# Patient Record
Sex: Female | Born: 1950 | Race: Black or African American | Hispanic: No | Marital: Single | Smoking: Former smoker
Health system: Southern US, Community
[De-identification: ages and names within clinical notes are randomized; demographics above are authoritative.]

## PROBLEM LIST (undated history)

## (undated) DIAGNOSIS — J8410 Pulmonary fibrosis, unspecified: Secondary | ICD-10-CM

## (undated) DIAGNOSIS — M069 Rheumatoid arthritis, unspecified: Secondary | ICD-10-CM

## (undated) DIAGNOSIS — G5600 Carpal tunnel syndrome, unspecified upper limb: Secondary | ICD-10-CM

## (undated) DIAGNOSIS — E7800 Pure hypercholesterolemia, unspecified: Secondary | ICD-10-CM

## (undated) DIAGNOSIS — M3500 Sicca syndrome, unspecified: Secondary | ICD-10-CM

## (undated) DIAGNOSIS — G709 Myoneural disorder, unspecified: Secondary | ICD-10-CM

## (undated) DIAGNOSIS — J301 Allergic rhinitis due to pollen: Secondary | ICD-10-CM

## (undated) DIAGNOSIS — M810 Age-related osteoporosis without current pathological fracture: Secondary | ICD-10-CM

## (undated) HISTORY — DX: Rheumatoid arthritis, unspecified: M06.9

## (undated) HISTORY — DX: Pulmonary fibrosis, unspecified: J84.10

## (undated) HISTORY — DX: Pure hypercholesterolemia, unspecified: E78.00

## (undated) HISTORY — DX: Allergic rhinitis due to pollen: J30.1

## (undated) HISTORY — DX: Myoneural disorder, unspecified: G70.9

## (undated) HISTORY — DX: Age-related osteoporosis without current pathological fracture: M81.0

## (undated) HISTORY — DX: Sjogren syndrome, unspecified: M35.00

## (undated) HISTORY — DX: Carpal tunnel syndrome, unspecified upper limb: G56.00

## (undated) HISTORY — PX: COLONOSCOPY: SHX174

---

## 1987-11-22 HISTORY — PX: TUBAL LIGATION: SHX77

## 1998-04-27 ENCOUNTER — Ambulatory Visit (HOSPITAL_COMMUNITY): Admission: RE | Admit: 1998-04-27 | Discharge: 1998-04-27 | Payer: Self-pay | Admitting: Internal Medicine

## 1998-05-05 ENCOUNTER — Ambulatory Visit (HOSPITAL_COMMUNITY): Admission: RE | Admit: 1998-05-05 | Discharge: 1998-05-05 | Payer: Self-pay | Admitting: Internal Medicine

## 2000-01-20 ENCOUNTER — Encounter: Payer: Self-pay | Admitting: Internal Medicine

## 2000-01-20 ENCOUNTER — Ambulatory Visit (HOSPITAL_COMMUNITY): Admission: RE | Admit: 2000-01-20 | Discharge: 2000-01-20 | Payer: Self-pay | Admitting: Internal Medicine

## 2001-01-01 ENCOUNTER — Ambulatory Visit (HOSPITAL_COMMUNITY): Admission: RE | Admit: 2001-01-01 | Discharge: 2001-01-01 | Payer: Self-pay | Admitting: Gastroenterology

## 2001-01-01 ENCOUNTER — Encounter (INDEPENDENT_AMBULATORY_CARE_PROVIDER_SITE_OTHER): Payer: Self-pay

## 2002-09-26 ENCOUNTER — Other Ambulatory Visit: Admission: RE | Admit: 2002-09-26 | Discharge: 2002-09-26 | Payer: Self-pay | Admitting: Obstetrics and Gynecology

## 2003-12-08 ENCOUNTER — Other Ambulatory Visit: Admission: RE | Admit: 2003-12-08 | Discharge: 2003-12-08 | Payer: Self-pay | Admitting: Obstetrics and Gynecology

## 2004-12-06 ENCOUNTER — Other Ambulatory Visit: Admission: RE | Admit: 2004-12-06 | Discharge: 2004-12-06 | Payer: Self-pay | Admitting: Obstetrics and Gynecology

## 2005-09-05 ENCOUNTER — Encounter: Admission: RE | Admit: 2005-09-05 | Discharge: 2005-09-05 | Payer: Self-pay | Admitting: Internal Medicine

## 2005-10-28 ENCOUNTER — Other Ambulatory Visit: Admission: RE | Admit: 2005-10-28 | Discharge: 2005-10-28 | Payer: Self-pay | Admitting: Internal Medicine

## 2006-08-14 ENCOUNTER — Encounter: Admission: RE | Admit: 2006-08-14 | Discharge: 2006-08-14 | Payer: Self-pay | Admitting: Internal Medicine

## 2007-11-06 ENCOUNTER — Other Ambulatory Visit: Admission: RE | Admit: 2007-11-06 | Discharge: 2007-11-06 | Payer: Self-pay | Admitting: Internal Medicine

## 2008-05-08 ENCOUNTER — Emergency Department (HOSPITAL_COMMUNITY): Admission: EM | Admit: 2008-05-08 | Discharge: 2008-05-08 | Payer: Self-pay | Admitting: Family Medicine

## 2009-12-21 ENCOUNTER — Other Ambulatory Visit: Admission: RE | Admit: 2009-12-21 | Discharge: 2009-12-21 | Payer: Self-pay | Admitting: Internal Medicine

## 2010-02-12 ENCOUNTER — Ambulatory Visit (HOSPITAL_BASED_OUTPATIENT_CLINIC_OR_DEPARTMENT_OTHER): Admission: RE | Admit: 2010-02-12 | Discharge: 2010-02-12 | Payer: Self-pay | Admitting: Ophthalmology

## 2011-03-02 ENCOUNTER — Encounter: Payer: Self-pay | Admitting: Internal Medicine

## 2011-03-25 ENCOUNTER — Ambulatory Visit
Admission: RE | Admit: 2011-03-25 | Discharge: 2011-03-25 | Disposition: A | Discharge status: Home / Self Care | Payer: BC Managed Care – PPO | Source: Ambulatory Visit | Attending: Internal Medicine | Admitting: Internal Medicine

## 2011-03-25 ENCOUNTER — Other Ambulatory Visit: Payer: Self-pay | Admitting: Internal Medicine

## 2011-03-25 DIAGNOSIS — R52 Pain, unspecified: Secondary | ICD-10-CM

## 2011-04-08 NOTE — Procedures (Signed)
[facility]  Patient:    Lorraine Blair, Lorraine Blair                   MRN: [id number] Proc. Date: 01/01/01 Adm. Date:  [id number] Attending:  Nelda Marseille CC:         Lind Guest. August Saucer, M.D.   Procedure Report  PROCEDURE:  Colonoscopy.  SURGEON:  Petra Kuba, M.D.  INDICATION:  Screening.  Consent was signed after risks, benefits, methods, and options were thoroughly discussed in the office.  MEDICINES USED:  Demerol 60 and Versed 6.  DESCRIPTION OF PROCEDURE:  Rectal inspection was pertinent for small external hemorrhoids.  Digital exam was negative.  The video pediatric colonoscope was inserted and easily advanced around the colon to the cecum.  This did require some left lower quadrant pressure and no position changes.  The cecum was identified by the appendiceal orifice and the ileocecal valve.  No obvious abnormality was seen on insertion.  The scope was inserted a short ways into the terminal ileum which was normal.  Photo documentation was obtained.  The scope was slowly withdrawn.  The prep was adequate.  There was some liquid stool that requiring washing and suctioning on slow withdrawal through the colon.  The ascending, transverse, descending, and majority of the sigmoid were normal.  In the distal sigmoid and rectum, three tiny probable hyperplastic-appearing polyps were seen and were cold biopsied x 1 or 2, and all put in the same container.  Once back in the rectum, the scope was retroflexed and pertinent for some internal hemorrhoids.  The scope was straightened, and readvanced a short ways up the sigmoid.  Air was suctioned and the scope removed.  The patient tolerated the procedure well.  There was no obvious or immediate complication.  ENDOSCOPIC DIAGNOSES: 1. Internal and external hemorrhoids. 2. Three distal sigmoid and rectal probable hyperplastic-appearing polyps,    status post cold biopsy. 4. Otherwise within normal limits  to the terminal ileum.  PLAN:  Await pathology to determine future colonic screening.  GI followup p.r.n. or yearly rectals and guaiacs per Dr. August Saucer or gynecology, as well as other health care maintenance. DD:  01/01/01 TD:  01/02/01 Job: [id number]

## 2011-10-11 ENCOUNTER — Encounter (HOSPITAL_BASED_OUTPATIENT_CLINIC_OR_DEPARTMENT_OTHER): Admission: RE | Payer: Self-pay | Source: Ambulatory Visit

## 2011-10-11 ENCOUNTER — Ambulatory Visit (HOSPITAL_BASED_OUTPATIENT_CLINIC_OR_DEPARTMENT_OTHER)
Admission: RE | Admit: 2011-10-11 | Payer: BC Managed Care – PPO | Source: Ambulatory Visit | Admitting: Orthopedic Surgery

## 2011-10-11 SURGERY — CARPAL TUNNEL RELEASE
Anesthesia: Choice | Laterality: Right

## 2011-11-18 ENCOUNTER — Ambulatory Visit
Admission: RE | Admit: 2011-11-18 | Discharge: 2011-11-18 | Disposition: A | Discharge status: Home / Self Care | Payer: BC Managed Care – PPO | Source: Ambulatory Visit | Attending: Internal Medicine | Admitting: Internal Medicine

## 2011-11-18 ENCOUNTER — Other Ambulatory Visit: Payer: Self-pay | Admitting: Internal Medicine

## 2011-11-18 ENCOUNTER — Other Ambulatory Visit (HOSPITAL_COMMUNITY)
Admission: RE | Admit: 2011-11-18 | Discharge: 2011-11-18 | Disposition: A | Discharge status: Home / Self Care | Payer: BC Managed Care – PPO | Source: Ambulatory Visit | Attending: Internal Medicine | Admitting: Internal Medicine

## 2011-11-18 DIAGNOSIS — M25561 Pain in right knee: Secondary | ICD-10-CM

## 2011-11-18 DIAGNOSIS — Z01419 Encounter for gynecological examination (general) (routine) without abnormal findings: Secondary | ICD-10-CM | POA: Insufficient documentation

## 2011-11-18 DIAGNOSIS — Z113 Encounter for screening for infections with a predominantly sexual mode of transmission: Secondary | ICD-10-CM | POA: Insufficient documentation

## 2012-03-10 LAB — BASIC METABOLIC PANEL
BUN: 13 mg/dL (ref 4–21)
Glucose: 83 mg/dL
Sodium: 140 mmol/L (ref 137–147)

## 2012-03-10 LAB — HEPATIC FUNCTION PANEL: Bilirubin, Total: 0.3 mg/dL

## 2012-07-27 ENCOUNTER — Other Ambulatory Visit: Payer: Self-pay | Admitting: Internal Medicine

## 2012-07-27 DIAGNOSIS — R1310 Dysphagia, unspecified: Secondary | ICD-10-CM

## 2012-08-01 ENCOUNTER — Other Ambulatory Visit: Payer: BC Managed Care – PPO

## 2012-08-10 ENCOUNTER — Ambulatory Visit
Admission: RE | Admit: 2012-08-10 | Discharge: 2012-08-10 | Disposition: A | Discharge status: Home / Self Care | Payer: BC Managed Care – PPO | Source: Ambulatory Visit | Attending: Internal Medicine | Admitting: Internal Medicine

## 2012-08-10 DIAGNOSIS — R1310 Dysphagia, unspecified: Secondary | ICD-10-CM

## 2013-01-02 ENCOUNTER — Encounter (HOSPITAL_BASED_OUTPATIENT_CLINIC_OR_DEPARTMENT_OTHER): Payer: BC Managed Care – PPO

## 2013-04-03 ENCOUNTER — Encounter: Payer: Self-pay | Admitting: Hematology

## 2013-06-24 ENCOUNTER — Encounter: Payer: Self-pay | Admitting: Internal Medicine

## 2013-06-24 ENCOUNTER — Ambulatory Visit (INDEPENDENT_AMBULATORY_CARE_PROVIDER_SITE_OTHER): Payer: BC Managed Care – PPO | Admitting: Internal Medicine

## 2013-06-24 ENCOUNTER — Telehealth: Payer: Self-pay | Admitting: Internal Medicine

## 2013-06-24 VITALS — BP 123/82 | HR 93 | Temp 98.7°F | Wt 170.0 lb

## 2013-06-24 DIAGNOSIS — E785 Hyperlipidemia, unspecified: Secondary | ICD-10-CM | POA: Insufficient documentation

## 2013-06-24 DIAGNOSIS — R109 Unspecified abdominal pain: Secondary | ICD-10-CM

## 2013-06-24 DIAGNOSIS — R1030 Lower abdominal pain, unspecified: Secondary | ICD-10-CM | POA: Insufficient documentation

## 2013-06-24 DIAGNOSIS — Z87898 Personal history of other specified conditions: Secondary | ICD-10-CM

## 2013-06-24 NOTE — Progress Notes (Signed)
Subjective:    Patient ID: Lorraine Blair, female    DOB: 1950/12/05, 62 y.o.   MRN: [id number]  HPI: Very healthy 62 year old here for annual visit and to establish care. Pt states that she has been relatively healthy except for occasional cramping pain in the supra pubic area. Pt states that she had been having the pain more frequently in the past and then it seemed to have subsided. However she has had 2 episodes in the last 2 weeks. The pain is of sudden onset and lasting for about 1-1/2 minutes and it resolves spontaneously. It is cramping and non-radiating in nature, and localized to the supra-pubic area. It is not associated with any other symptoms, and he is unable to identify any palliative ot provocative features.  In review of her records it is noted that she was prescribed Crestor for Hyperlipidemia, but has not taken it for almost 1 year.  She also has Carpel tunnel Syndrome and was under the care of Dr. Merlyn Lot. She has not followed up and stated that since she has retired, she has had minimal symptoms limited to numbness with prolonged positioning. She does not want to pursue any surgical intervention at this time.     Review of Systems  Constitutional: Negative.   HENT: Negative.   Eyes: Negative.   Respiratory: Negative.   Cardiovascular: Negative.   Gastrointestinal: Negative.   Endocrine: Negative.   Genitourinary: Negative.  Negative for vaginal bleeding, vaginal discharge and difficulty urinating.  Musculoskeletal: Negative for myalgias.  Skin: Negative.   Allergic/Immunologic: Negative.   Neurological: Positive for numbness (Patient has numbness and tingling bilateral arms with prolonged positioning at wrists.).  Hematological: Negative.   Psychiatric/Behavioral: Negative.        Objective:   Physical Exam  Constitutional: She is oriented to person, place, and time. She appears well-developed and well-nourished.  HENT:  Head: Normocephalic and atraumatic.   Eyes: Conjunctivae and EOM are normal. Pupils are equal, round, and reactive to light. No scleral icterus.  Neck: Normal range of motion. Neck supple. No JVD present. No thyromegaly present.  Cardiovascular: Normal rate and regular rhythm.  Exam reveals no gallop and no friction rub.   No murmur heard. Pulmonary/Chest: Effort normal and breath sounds normal. She has no wheezes. She has no rales.  Abdominal: Soft. Bowel sounds are normal. She exhibits no distension and no mass. There is no tenderness.  Musculoskeletal: Normal range of motion.  Neurological: She is alert and oriented to person, place, and time. No cranial nerve deficit.  Skin: Skin is warm and dry.  Psychiatric: She has a normal mood and affect. Her behavior is normal. Judgment and thought content normal.          Assessment & Plan:  1. Abdominal Pain: Pt's description of pain is most consistent with bladder spasms vs abdominal wall spasms. Pt does not want any antispasmodics at this time. Will observe.  2. Hyperlipidemia: Will check fasting lipid profile particularly since patient has discontinued her Crestor.  3. Carpel tunnel Syndrome: Pt appears to have decreased symptoms since she has had decreased use of BUE's associated with her work duties. No further intervention at this time.  4. Family history of PE: Pt's daughter died of a PE and she had a concern about her risk. Her daughter had no clear risk factors and patient is concerned about her own risk of a hypercoagulability. I will check a hypercoagulable panel on this patient.  5. Health maintenance: Pt  has had a recent mammogram and does not remember when she had her last pelvic examination. Although she is not sexually active. Will review records.

## 2013-06-25 ENCOUNTER — Other Ambulatory Visit: Payer: BC Managed Care – PPO | Admitting: *Deleted

## 2013-06-25 ENCOUNTER — Other Ambulatory Visit: Payer: Self-pay | Admitting: Internal Medicine

## 2013-06-25 DIAGNOSIS — R109 Unspecified abdominal pain: Secondary | ICD-10-CM

## 2013-06-25 DIAGNOSIS — E785 Hyperlipidemia, unspecified: Secondary | ICD-10-CM

## 2013-06-25 DIAGNOSIS — Z87898 Personal history of other specified conditions: Secondary | ICD-10-CM

## 2013-06-25 LAB — BASIC METABOLIC PANEL
BUN: 12 mg/dL (ref 6–23)
Calcium: 9.3 mg/dL (ref 8.4–10.5)
Glucose, Bld: 98 mg/dL (ref 70–99)
Sodium: 137 mEq/L (ref 135–145)

## 2013-06-25 LAB — CBC WITH DIFFERENTIAL/PLATELET
Basophils Absolute: 0 10*3/uL (ref 0.0–0.1)
HCT: 35.5 % — ABNORMAL LOW (ref 36.0–46.0)
Lymphocytes Relative: 56 % — ABNORMAL HIGH (ref 12–46)
Neutro Abs: 1.1 10*3/uL — ABNORMAL LOW (ref 1.7–7.7)
Neutrophils Relative %: 34 % — ABNORMAL LOW (ref 43–77)
Platelets: 235 10*3/uL (ref 150–400)
RDW: 15.6 % — ABNORMAL HIGH (ref 11.5–15.5)
WBC: 3.4 10*3/uL — ABNORMAL LOW (ref 4.0–10.5)

## 2013-06-25 LAB — LIPID PANEL
Cholesterol: 193 mg/dL (ref 0–200)
HDL: 52 mg/dL
LDL Cholesterol: 123 mg/dL — ABNORMAL HIGH (ref 0–99)
Total CHOL/HDL Ratio: 3.7 ratio
Triglycerides: 91 mg/dL
VLDL: 18 mg/dL (ref 0–40)

## 2013-06-27 LAB — HYPERCOAGULABLE PANEL, COMPREHENSIVE
Anticardiolipin IgG: 8 GPL U/mL (ref <23)
Beta-2 Glyco I IgG: 6 G Units (ref <20)
Beta-2-Glycoprotein I IgA: 18 A Units (ref <20)
Lupus Anticoagulant: NOT DETECTED
Protein C Activity: 146 % — ABNORMAL HIGH (ref 75–133)
Protein C, Total: 65 % — ABNORMAL LOW (ref 72–160)
Protein S Total: 91 % (ref 60–150)

## 2014-02-19 LAB — HM MAMMOGRAPHY

## 2014-03-27 ENCOUNTER — Encounter: Payer: BC Managed Care – PPO | Admitting: Internal Medicine

## 2014-04-07 ENCOUNTER — Encounter: Payer: BC Managed Care – PPO | Admitting: Internal Medicine

## 2014-07-17 ENCOUNTER — Encounter: Payer: Self-pay | Admitting: Family Medicine

## 2014-07-17 ENCOUNTER — Ambulatory Visit (INDEPENDENT_AMBULATORY_CARE_PROVIDER_SITE_OTHER): Payer: BC Managed Care – PPO | Admitting: Family Medicine

## 2014-07-17 VITALS — BP 131/79 | HR 75 | Temp 98.5°F | Resp 16 | Ht 66.0 in | Wt 165.0 lb

## 2014-07-17 DIAGNOSIS — Z1211 Encounter for screening for malignant neoplasm of colon: Secondary | ICD-10-CM | POA: Insufficient documentation

## 2014-07-17 DIAGNOSIS — Z0000 Encounter for general adult medical examination without abnormal findings: Secondary | ICD-10-CM

## 2014-07-17 DIAGNOSIS — E785 Hyperlipidemia, unspecified: Secondary | ICD-10-CM

## 2014-07-17 DIAGNOSIS — Z23 Encounter for immunization: Secondary | ICD-10-CM | POA: Insufficient documentation

## 2014-07-17 NOTE — Progress Notes (Signed)
   Subjective:    Patient ID: Lorraine Blair, female    DOB: 07/12/1951, 63 y.o.   MRN: [id number]  HPI  Lorraine Blair is a 63 year old well appearing female that presents for follow up of hyperlipidemia. She states that she has been doing well and has not had any illnesses throughout the year. Lorraine Blair denies chest pain, dyspnea, fatigue, palpitations and syncope. There is a family history of hyperlipidemia however; there is not a family history of early ischemia heart disease.  Past Medical History  Diagnosis Date  . Allergic rhinitis due to pollen   . Pure hypercholesterolemia   . Neuromuscular disorder   . Carpal tunnel syndrome    Review of Systems  Constitutional: Negative.   HENT: Negative.   Respiratory: Negative.   Cardiovascular: Negative.   Gastrointestinal: Negative.   Endocrine: Negative.   Musculoskeletal: Negative.   Skin: Negative.   Allergic/Immunologic: Negative.   Neurological: Negative.   Hematological: Negative.   Psychiatric/Behavioral: Negative.        Objective:   Physical Exam  Constitutional: She is oriented to person, place, and time. She appears well-developed and well-nourished.  HENT:  Head: Normocephalic and atraumatic.  Right Ear: External ear normal.  Left Ear: External ear normal.  Mouth/Throat: Oropharynx is clear and moist.  Eyes: Pupils are equal, round, and reactive to light.  Neck: Normal range of motion. Neck supple.  Cardiovascular: Normal rate, regular rhythm, normal heart sounds and intact distal pulses.   Pulmonary/Chest: Effort normal and breath sounds normal.  Abdominal: Soft. Bowel sounds are normal.  Musculoskeletal: Normal range of motion.  Neurological: She is alert and oriented to person, place, and time. She has normal reflexes.  Skin: Skin is warm and dry.  Psychiatric: She has a normal mood and affect. Her behavior is normal. Judgment and thought content normal.      BP 131/79  Pulse 75  Temp(Src) 98.5 F  (36.9 C) (Oral)  Resp 16  Ht 5\' 6"  (1.676 m)  Wt 165 lb (74.844 kg)  BMI 26.64 kg/m2  SpO2 100%    Assessment & Plan:   1. Other and unspecified hyperlipidemia -Recommend that she continues daily exercise and lowfat diet. Discussed last cholesterol panel results. LDL choleserol mildly elevated. Recommend that she start a daily aspirin 81 mg and OTC omega 3 fatty acid tablet.  -Lipid panel (future order)   2. Immunization due  - Pneumococcal polysaccharide vaccine 23-valent greater than or equal to 2yo subcutaneous/IM -She refused influenza vaccination. She reports that she usually does not take influenza vaccination.   3. Encounter for screening colonoscopy  - It has been greater than 10 years since last colonoscopy.  -Ambulatory referral to Gastroenterology  4. Need for Tdap vaccination  - Tdap vaccine greater than or equal to 7yo IM   .5. Annual physical exam Patient states that it has been 1 year since last CPE. Will schedule CPE w/pap with Dr. Zigmund Daniel.  She will get labs 1 week prior to CPE.  - TSH; Future - Lipid Panel; Future - COMPLETE METABOLIC PANEL WITH GFR; Future - CBC with Differential; Future -Last mammogram was 3 weeks ago. It was negative, per patient.     Dorena Dew, FNP

## 2014-07-17 NOTE — Patient Instructions (Signed)
Recommend daily Aspirn 81 mg chewable Recommend Omega 3 Fatty acid

## 2014-07-24 ENCOUNTER — Encounter: Payer: Self-pay | Admitting: Internal Medicine

## 2014-09-10 ENCOUNTER — Ambulatory Visit (AMBULATORY_SURGERY_CENTER): Payer: Self-pay | Admitting: *Deleted

## 2014-09-10 VITALS — Ht 66.0 in | Wt 165.8 lb

## 2014-09-10 DIAGNOSIS — Z1211 Encounter for screening for malignant neoplasm of colon: Secondary | ICD-10-CM

## 2014-09-10 MED ORDER — MOVIPREP 100 G PO SOLR: ORAL | Status: DC

## 2014-09-10 NOTE — Progress Notes (Signed)
No allergies to eggs or soy. No problems with anesthesia.  Pt given Emmi instructions for colonoscopy  No oxygen use  No diet drug use  

## 2014-09-16 ENCOUNTER — Encounter: Payer: Self-pay | Admitting: Internal Medicine

## 2014-09-22 ENCOUNTER — Encounter: Payer: Self-pay | Admitting: Internal Medicine

## 2014-09-22 ENCOUNTER — Ambulatory Visit (AMBULATORY_SURGERY_CENTER): Payer: BC Managed Care – PPO | Admitting: Internal Medicine

## 2014-09-22 VITALS — BP 104/54 | HR 63 | Temp 97.2°F | Resp 14 | Ht 66.0 in | Wt 165.0 lb

## 2014-09-22 DIAGNOSIS — Z1211 Encounter for screening for malignant neoplasm of colon: Secondary | ICD-10-CM

## 2014-09-22 DIAGNOSIS — D125 Benign neoplasm of sigmoid colon: Secondary | ICD-10-CM

## 2014-09-22 MED ORDER — SODIUM CHLORIDE 0.9 % IV SOLN: 500.0000 mL | INTRAVENOUS | Status: DC

## 2014-09-22 NOTE — Progress Notes (Signed)
Procedure ends, to recovery, report given and VSS. 

## 2014-09-22 NOTE — Op Note (Signed)
[facility]  [address] [place], [address]   1COLONOSCOPY PROCEDURE REPORT  PATIENT: Lorraine Blair, Lorraine Blair  MR#: [id number] BIRTHDATE: 1950-11-25 , 63  yrs. old GENDER: female ENDOSCOPIST: Eustace Quail, MD REFERRED [id number] Zigmund Daniel, M.D. PROCEDURE DATE:  09/22/2014 PROCEDURE:   Colonoscopy with snare polypectomy -1 First Screening Colonoscopy - Avg.  risk and is 50 yrs.  old or older - No.  Prior Negative Screening - Now for repeat screening. 10 or more years since last screening  History of Adenoma - Now for follow-up colonoscopy & has been > or = to 3 yrs.  N/A  Polyps Removed Today? Yes. ASA CLASS:   Class II INDICATIONS:average risk for colorectal cancer.  . Reports negative screening exam at age 55 (elsewhere) MEDICATIONS: Monitored anesthesia care and Propofol 230 mg IV  DESCRIPTION OF PROCEDURE:   After the risks benefits and alternatives of the procedure were thoroughly explained, informed consent was obtained.  The digital rectal exam revealed no abnormalities of the rectum.   The LB [id number]  endoscope was introduced through the anus and advanced to the cecum, which was identified by both the appendix and ileocecal valve. No adverse events experienced.   The quality of the prep was excellent, using MoviPrep  The instrument was then slowly withdrawn as the colon was fully examined.    COLON FINDINGS: A single polyp measuring 3 mm in size was found in the sigmoid colon.  A polypectomy was performed with a cold snare. The resection was complete, the polyp tissue was completely retrieved and sent to histology.   There was mild diverticulosis noted in the sigmoid colon.   The examination was otherwise normal. Retroflexed views revealed no abnormalities. The time to cecum=3 minutes 08 seconds.  Withdrawal time=13 minutes 36 seconds.  The scope was withdrawn and the procedure completed. COMPLICATIONS: There were no immediate  complications.  ENDOSCOPIC IMPRESSION: 1.   Single polyp measuring 3 mm in size was found in the sigmoid colon; polypectomy was performed with a cold snare 2.   Mild diverticulosis was noted in the sigmoid colon 3.   The examination was otherwise normal  RECOMMENDATIONS: 1. Repeat colonoscopy in 5 years if polyp adenomatous; otherwise 10 years  eSigned:  Eustace Quail, MD 09/22/2014 11:06 AM   cc: The Patient and Liston Alba MD

## 2014-09-22 NOTE — Progress Notes (Signed)
Patient denies any allergies to eggs or soy. Patient denies any problems with anesthesia/sedation. Patient denies any oxygen use at home.

## 2014-09-22 NOTE — Patient Instructions (Signed)
Discharge instructions given with verbal understanding. Handouts on polyps and diverticulosis. Resume previous medications. YOU HAD AN ENDOSCOPIC PROCEDURE TODAY AT THE [facility]: Refer to the procedure report that was given to you for any specific questions about what was found during the examination.  If the procedure report does not answer your questions, please call your gastroenterologist to clarify.  If you requested that your care partner not be given the details of your procedure findings, then the procedure report has been included in a sealed envelope for you to review at your convenience later.  YOU SHOULD EXPECT: Some feelings of bloating in the abdomen. Passage of more gas than usual.  Walking can help get rid of the air that was put into your GI tract during the procedure and reduce the bloating. If you had a lower endoscopy (such as a colonoscopy or flexible sigmoidoscopy) you may notice spotting of blood in your stool or on the toilet paper. If you underwent a bowel prep for your procedure, then you may not have a normal bowel movement for a few days.  DIET: Your first meal following the procedure should be a light meal and then it is ok to progress to your normal diet.  A half-sandwich or bowl of soup is an example of a good first meal.  Heavy or fried foods are harder to digest and may make you feel nauseous or bloated.  Likewise meals heavy in dairy and vegetables can cause extra gas to form and this can also increase the bloating.  Drink plenty of fluids but you should avoid alcoholic beverages for 24 hours.  ACTIVITY: Your care partner should take you home directly after the procedure.  You should plan to take it easy, moving slowly for the rest of the day.  You can resume normal activity the day after the procedure however you should NOT DRIVE or use heavy machinery for 24 hours (because of the sedation medicines used during the test).    SYMPTOMS TO REPORT  IMMEDIATELY: A gastroenterologist can be reached at any hour.  During normal business hours, 8:30 AM to 5:00 PM Monday through Friday, call [phone].  After hours and on weekends, please call the GI answering service at [phone] who will take a message and have the physician on call contact you.   Following lower endoscopy (colonoscopy or flexible sigmoidoscopy):  Excessive amounts of blood in the stool  Significant tenderness or worsening of abdominal pains  Swelling of the abdomen that is new, acute  Fever of 100F or higher  FOLLOW UP: If any biopsies were taken you will be contacted by phone or by letter within the next 1-3 weeks.  Call your gastroenterologist if you have not heard about the biopsies in 3 weeks.  Our staff will call the home number listed on your records the next business day following your procedure to check on you and address any questions or concerns that you may have at that time regarding the information given to you following your procedure. This is a courtesy call and so if there is no answer at the home number and we have not heard from you through the emergency physician on call, we will assume that you have returned to your regular daily activities without incident.  SIGNATURES/CONFIDENTIALITY: You and/or your care partner have signed paperwork which will be entered into your electronic medical record.  These signatures attest to the fact that that the information above on your After Visit Summary   has been reviewed and is understood.  Full responsibility of the confidentiality of this discharge information lies with you and/or your care-partner. 

## 2014-09-22 NOTE — Progress Notes (Signed)
Called to room to assist during endoscopic procedure.  Patient ID and intended procedure confirmed with present staff. Received instructions for my participation in the procedure from the performing physician.  

## 2014-09-23 ENCOUNTER — Telehealth: Payer: Self-pay | Admitting: *Deleted

## 2014-09-23 NOTE — Telephone Encounter (Signed)
  Follow up Call-  Call back number 09/22/2014  Post procedure Call Back phone  # [phone]  Permission to leave phone message Yes     Patient questions:  Do you have a fever, pain , or abdominal swelling? No. Pain Score  0 *  Have you tolerated food without any problems? Yes.    Have you been able to return to your normal activities? Yes.    Do you have any questions about your discharge instructions: Diet   No. Medications  No. Follow up visit  No.  Do you have questions or concerns about your Care? No.  Actions: * If pain score is 4 or above: No action needed, pain <4.

## 2014-09-26 ENCOUNTER — Encounter: Payer: Self-pay | Admitting: Internal Medicine

## 2014-10-13 ENCOUNTER — Other Ambulatory Visit (INDEPENDENT_AMBULATORY_CARE_PROVIDER_SITE_OTHER): Payer: BC Managed Care – PPO

## 2014-10-13 DIAGNOSIS — Z0000 Encounter for general adult medical examination without abnormal findings: Secondary | ICD-10-CM

## 2014-10-14 LAB — LIPID PANEL
Cholesterol: 192 mg/dL (ref 0–200)
HDL: 52 mg/dL (ref >39)
LDL CALC: 122 mg/dL — AB (ref 0–99)
TRIGLYCERIDES: 90 mg/dL (ref <150)
Total CHOL/HDL Ratio: 3.7 Ratio
VLDL: 18 mg/dL (ref 0–40)

## 2014-10-14 LAB — CBC WITH DIFFERENTIAL/PLATELET
BASOS ABS: 0 10*3/uL (ref 0.0–0.1)
Basophils Relative: 0 % (ref 0–1)
EOS ABS: 0.1 10*3/uL (ref 0.0–0.7)
EOS PCT: 2 % (ref 0–5)
HCT: 38.2 % (ref 36.0–46.0)
Hemoglobin: 12.7 g/dL (ref 12.0–15.0)
Lymphocytes Relative: 58 % — ABNORMAL HIGH (ref 12–46)
Lymphs Abs: 2.2 10*3/uL (ref 0.7–4.0)
MCH: 29.3 pg (ref 26.0–34.0)
MCHC: 33.2 g/dL (ref 30.0–36.0)
MCV: 88 fL (ref 78.0–100.0)
MPV: 9.5 fL (ref 9.4–12.4)
Monocytes Absolute: 0.3 10*3/uL (ref 0.1–1.0)
Monocytes Relative: 8 % (ref 3–12)
Neutro Abs: 1.2 10*3/uL — ABNORMAL LOW (ref 1.7–7.7)
Neutrophils Relative %: 32 % — ABNORMAL LOW (ref 43–77)
PLATELETS: 253 10*3/uL (ref 150–400)
RBC: 4.34 MIL/uL (ref 3.87–5.11)
RDW: 15.2 % (ref 11.5–15.5)
WBC: 3.8 10*3/uL — AB (ref 4.0–10.5)

## 2014-10-14 LAB — COMPLETE METABOLIC PANEL WITH GFR
ALT: 10 U/L (ref 0–35)
AST: 18 U/L (ref 0–37)
Albumin: 4.1 g/dL (ref 3.5–5.2)
Alkaline Phosphatase: 86 U/L (ref 39–117)
BILIRUBIN TOTAL: 0.4 mg/dL (ref 0.2–1.2)
BUN: 12 mg/dL (ref 6–23)
CALCIUM: 9.6 mg/dL (ref 8.4–10.5)
CHLORIDE: 104 meq/L (ref 96–112)
CO2: 29 mEq/L (ref 19–32)
CREATININE: 0.8 mg/dL (ref 0.50–1.10)
GFR, Est African American: >89 mL/min
GFR, Est Non African American: 79 mL/min
Glucose, Bld: 96 mg/dL (ref 70–99)
Potassium: 4.4 mEq/L (ref 3.5–5.3)
SODIUM: 141 meq/L (ref 135–145)
TOTAL PROTEIN: 8.2 g/dL (ref 6.0–8.3)

## 2014-10-14 LAB — TSH: TSH: 2.782 u[IU]/mL (ref 0.350–4.500)

## 2014-10-22 ENCOUNTER — Other Ambulatory Visit (HOSPITAL_COMMUNITY)
Admission: RE | Admit: 2014-10-22 | Discharge: 2014-10-22 | Disposition: A | Discharge status: Home / Self Care | Payer: BC Managed Care – PPO | Source: Ambulatory Visit | Attending: Internal Medicine | Admitting: Internal Medicine

## 2014-10-22 ENCOUNTER — Ambulatory Visit (INDEPENDENT_AMBULATORY_CARE_PROVIDER_SITE_OTHER): Payer: BC Managed Care – PPO | Admitting: Family Medicine

## 2014-10-22 VITALS — BP 123/78 | HR 77 | Temp 98.3°F | Resp 16 | Ht 66.0 in | Wt 166.0 lb

## 2014-10-22 DIAGNOSIS — Z01419 Encounter for gynecological examination (general) (routine) without abnormal findings: Secondary | ICD-10-CM | POA: Diagnosis present

## 2014-10-22 DIAGNOSIS — Z23 Encounter for immunization: Secondary | ICD-10-CM

## 2014-10-22 DIAGNOSIS — Z0000 Encounter for general adult medical examination without abnormal findings: Secondary | ICD-10-CM

## 2014-10-22 LAB — POC HEMOCCULT BLD/STL (OFFICE/1-CARD/DIAGNOSTIC): OCCULT BLOOD DATE: NEGATIVE

## 2014-10-22 NOTE — Progress Notes (Signed)
Subjective:    Patient ID: Lorraine Blair, female    DOB: [birth year], 63 y.o.   MRN: [id number]  HPI Lorraine Blair is a pleasant 63 year old that presents for a complete physical examination. Lorraine Blair states that she feels well and is without current complaint. She states that she consistently eats a balanced diet and exercises 2-3 times per week. She also remains active and travels frequently. She is post menopausal and has not been sexually active for many years. She is up to date on mammogram, colonoscopy, and vaccinations. She maintains that she sleeps 6-7 hours per night.  She currently denies fatigue, headache, pain, weakness, urinary issues, nausea, vomiting, or diarrhea.    Past Medical History  Diagnosis Date  . Allergic rhinitis due to pollen   . Pure hypercholesterolemia   . Neuromuscular disorder   . Carpal tunnel syndrome      Review of Systems  Constitutional: Negative.   HENT: Negative.   Eyes: Negative.   Respiratory: Negative.   Cardiovascular: Negative.   Gastrointestinal: Negative.   Endocrine: Negative.   Genitourinary: Negative.   Musculoskeletal: Negative.   Skin: Negative.   Allergic/Immunologic: Negative.   Neurological: Negative.  Negative for dizziness and headaches.  Hematological: Negative.   Psychiatric/Behavioral: Negative.  Negative for suicidal ideas, behavioral problems and sleep disturbance.       Objective:   Physical Exam  Constitutional: She is oriented to person, place, and time. She appears well-developed and well-nourished.  HENT:  Head: Normocephalic and atraumatic.  Right Ear: External ear normal.  Left Ear: External ear normal.  Eyes: Conjunctivae and lids are normal. Pupils are equal, round, and reactive to light. Lids are everted and swept, no foreign bodies found.  Neck: Trachea normal and normal range of motion. Neck supple.  Cardiovascular: Normal rate, regular rhythm and normal heart sounds.   No murmur  heard. Pulmonary/Chest: Effort normal and breath sounds normal. She has no decreased breath sounds.  Abdominal: Soft. Normal appearance and bowel sounds are normal.  Genitourinary: Rectum normal and uterus normal. Rectal exam shows no external hemorrhoid and no internal hemorrhoid. Guaiac negative stool. Cervix exhibits no discharge. Right adnexum displays no mass and no tenderness. Left adnexum displays no mass and no tenderness.  Musculoskeletal: Normal range of motion.  Lymphadenopathy:       Head (right side): No submental, no submandibular and no tonsillar adenopathy present.       Head (left side): No submental, no submandibular and no tonsillar adenopathy present.    She has no cervical adenopathy.    She has no axillary adenopathy.  Neurological: She is alert and oriented to person, place, and time. She has normal strength and normal reflexes. No cranial nerve deficit or sensory deficit. She displays a negative Romberg sign.  Skin: Skin is warm and dry.  Psychiatric: She has a normal mood and affect. Her behavior is normal. Judgment and thought content normal.         BP 123/78 mmHg  Pulse 77  Temp(Src) 98.3 F (36.8 C) (Oral)  Resp 16  Ht 5\' 6"  (1.676 m)  Wt 166 lb (75.297 kg)  BMI 26.81 kg/m2 Assessment & Plan:  1.  Annual physical exam - Urinalysis, Complete - POC Hemoccult Bld/Stl (1-Cd Office Dx) She states that she goes to the dentist twice yearly.  Last eye examination was 6 months ago.  Reviewed labs from  10/13/2014. Discussed labs at length Colonoscopy: 09/22/2014, reviewed single benign polyp, recommend  repeating in 10 years. 2. Dyslipidemia:  Reviewed lipid panel results. ASCVD score is 6.7 %. Patient is on consistent diet and exercise regimen. She has a family history of high cholesterol. She is not interested in pharmacotherapy at this time.   2. Encounter for routine gynecological examination - Cytology - PAP [place] Last mammogram in April 2015,  normal per patient.   3. Need for prophylactic vaccination and inoculation against influenza - Flu Vaccine QUAD 36+ mos PF IM (Fluarix Quad PF)  4. Immunization due - Varicella-zoster vaccine subcutaneous   RTC: 1 year cholesterol check Dorena Dew, FNP

## 2014-10-22 NOTE — Patient Instructions (Signed)
Herpes Zoster Virus Vaccine What is this medicine? HERPES ZOSTER VIRUS VACCINE (HUR peez ZOS ter vahy ruhs vak SEEN) is a vaccine. It is used to prevent shingles in adults 63 years old and over. This vaccine is not used to treat shingles or nerve pain from shingles. This medicine may be used for other purposes; ask your health care provider or pharmacist if you have questions. COMMON BRAND NAME(S): Varivax, Zostavax What should I tell my health care provider before I take this medicine? They need to know if you have any of these conditions: -cancer like leukemia or lymphoma -immune system problems or therapy -infection with fever -tuberculosis -an unusual or allergic reaction to vaccines, neomycin, gelatin, other medicines, foods, dyes, or preservatives -pregnant or trying to get pregnant -breast-feeding How should I use this medicine? This vaccine is for injection under the skin. It is given by a health care professional. Talk to your pediatrician regarding the use of this medicine in children. This medicine is not approved for use in children. Overdosage: If you think you have taken too much of this medicine contact a poison control center or emergency room at once. NOTE: This medicine is only for you. Do not share this medicine with others. What if I miss a dose? This does not apply. What may interact with this medicine? Do not take this medicine with any of the following medications: -adalimumab -anakinra -etanercept -infliximab -medicines to treat cancer -medicines that suppress your immune system This medicine may also interact with the following medications: -immunoglobulins -steroid medicines like prednisone or cortisone This list may not describe all possible interactions. Give your health care provider a list of all the medicines, herbs, non-prescription drugs, or dietary supplements you use. Also tell them if you smoke, drink alcohol, or use illegal drugs. Some items may  interact with your medicine. What should I watch for while using this medicine? Visit your doctor for regular check ups. This vaccine, like all vaccines, may not fully protect everyone. After receiving this vaccine it may be possible to pass chickenpox infection to others. Avoid people with immune system problems, pregnant women who have not had chickenpox, and newborns of women who have not had chickenpox. Talk to your doctor for more information. What side effects may I notice from receiving this medicine? Side effects that you should report to your doctor or health care professional as soon as possible: -allergic reactions like skin rash, itching or hives, swelling of the face, lips, or tongue -breathing problems -feeling faint or lightheaded, falls -fever, flu-like symptoms -pain, tingling, numbness in the hands or feet -swelling of the ankles, feet, hands -unusually weak or tired Side effects that usually do not require medical attention (report to your doctor or health care professional if they continue or are bothersome): -aches or pains -chickenpox-like rash -diarrhea -headache -loss of appetite -nausea, vomiting -redness, pain, swelling at site where injected -runny nose This list may not describe all possible side effects. Call your doctor for medical advice about side effects. You may report side effects to FDA at 1-800-FDA-1088. Where should I keep my medicine? This drug is given in a hospital or clinic and will not be stored at home. NOTE: This sheet is a summary. It may not cover all possible information. If you have questions about this medicine, talk to your doctor, pharmacist, or health care provider.  2015, Elsevier/Gold Standard. (2010-04-26 17:43:50) Influenza Virus Vaccine injection What is this medicine? INFLUENZA VIRUS VACCINE (in floo EN zuh VAHY ruhs  vak SEEN) helps to reduce the risk of getting influenza also known as the flu. The vaccine only helps protect  you against some strains of the flu. This medicine may be used for other purposes; ask your health care provider or pharmacist if you have questions. COMMON BRAND NAME(S): Afluria, Agriflu, Fluarix, Fluarix Quadrivalent, FLUCELVAX, Flulaval, Fluvirin, Fluzone, Fluzone High-Dose, Fluzone Intradermal What should I tell my health care provider before I take this medicine? They need to know if you have any of these conditions: -bleeding disorder like hemophilia -fever or infection -Guillain-Barre syndrome or other neurological problems -immune system problems -infection with the human immunodeficiency virus (HIV) or AIDS -low blood platelet counts -multiple sclerosis -an unusual or allergic reaction to influenza virus vaccine, latex, other medicines, foods, dyes, or preservatives. Different brands of vaccines contain different allergens. Some may contain latex or eggs. Talk to your doctor about your allergies to make sure that you get the right vaccine. -pregnant or trying to get pregnant -breast-feeding How should I use this medicine? This vaccine is for injection into a muscle or under the skin. It is given by a health care professional. A copy of Vaccine Information Statements will be given before each vaccination. Read this sheet carefully each time. The sheet may change frequently. Talk to your healthcare provider to see which vaccines are right for you. Some vaccines should not be used in all age groups. Overdosage: If you think you have taken too much of this medicine contact a poison control center or emergency room at once. NOTE: This medicine is only for you. Do not share this medicine with others. What if I miss a dose? This does not apply. What may interact with this medicine? -chemotherapy or radiation therapy -medicines that lower your immune system like etanercept, anakinra, infliximab, and adalimumab -medicines that treat or prevent blood clots like  warfarin -phenytoin -steroid medicines like prednisone or cortisone -theophylline -vaccines This list may not describe all possible interactions. Give your health care provider a list of all the medicines, herbs, non-prescription drugs, or dietary supplements you use. Also tell them if you smoke, drink alcohol, or use illegal drugs. Some items may interact with your medicine. What should I watch for while using this medicine? Report any side effects that do not go away within 3 days to your doctor or health care professional. Call your health care provider if any unusual symptoms occur within 6 weeks of receiving this vaccine. You may still catch the flu, but the illness is not usually as bad. You cannot get the flu from the vaccine. The vaccine will not protect against colds or other illnesses that may cause fever. The vaccine is needed every year. What side effects may I notice from receiving this medicine? Side effects that you should report to your doctor or health care professional as soon as possible: -allergic reactions like skin rash, itching or hives, swelling of the face, lips, or tongue Side effects that usually do not require medical attention (report to your doctor or health care professional if they continue or are bothersome): -fever -headache -muscle aches and pains -pain, tenderness, redness, or swelling at the injection site -tiredness This list may not describe all possible side effects. Call your doctor for medical advice about side effects. You may report side effects to FDA at 1-800-FDA-1088. Where should I keep my medicine? The vaccine will be given by a health care professional in a clinic, pharmacy, doctor's office, or other health care setting. You will not  be given vaccine doses to store at home. NOTE: This sheet is a summary. It may not cover all possible information. If you have questions about this medicine, talk to your doctor, pharmacist, or health care  provider.  2015, Elsevier/Gold Standard. (2012-05-17 78:29:56) Food Choices to Lower Your Triglycerides  Triglycerides are a type of fat in your blood. High levels of triglycerides can increase the risk of heart disease and stroke. If your triglyceride levels are high, the foods you eat and your eating habits are very important. Choosing the right foods can help lower your triglycerides.  WHAT GENERAL GUIDELINES DO I NEED TO FOLLOW?  Lose weight if you are overweight.   Limit or avoid alcohol.   Fill one half of your plate with vegetables and green salads.   Limit fruit to two servings a day. Choose fruit instead of juice.   Make one fourth of your plate whole grains. Look for the word "whole" as the first word in the ingredient list.  Fill one fourth of your plate with lean protein foods.  Enjoy fatty fish (such as salmon, mackerel, sardines, and tuna) three times a week.   Choose healthy fats.   Limit foods high in starch and sugar.  Eat more home-cooked food and less restaurant, buffet, and fast food.  Limit fried foods.  Cook foods using methods other than frying.  Limit saturated fats.  Check ingredient lists to avoid foods with partially hydrogenated oils (trans fats) in them. WHAT FOODS CAN I EAT?  Grains Whole grains, such as whole wheat or whole grain breads, crackers, cereals, and pasta. Unsweetened oatmeal, bulgur, barley, quinoa, or brown rice. Corn or whole wheat flour tortillas.  Vegetables Fresh or frozen vegetables (raw, steamed, roasted, or grilled). Green salads. Fruits All fresh, canned (in natural juice), or frozen fruits. Meat and Other Protein Products Ground beef (85% or leaner), grass-fed beef, or beef trimmed of fat. Skinless chicken or [place]. Ground chicken or [place]. Pork trimmed of fat. All fish and seafood. Eggs. Dried beans, peas, or lentils. Unsalted nuts or seeds. Unsalted canned or dry beans. Dairy Low-fat dairy products, such as  skim or 1% milk, 2% or reduced-fat cheeses, low-fat ricotta or cottage cheese, or plain low-fat yogurt. Fats and Oils Tub margarines without trans fats. Light or reduced-fat mayonnaise and salad dressings. Avocado. Safflower, olive, or canola oils. Natural peanut or almond butter. The items listed above may not be a complete list of recommended foods or beverages. Contact your dietitian for more options. WHAT FOODS ARE NOT RECOMMENDED?  Grains White bread. White pasta. White rice. Cornbread. Bagels, pastries, and croissants. Crackers that contain trans fat. Vegetables White potatoes. Corn. Creamed or fried vegetables. Vegetables in a cheese sauce. Fruits Dried fruits. Canned fruit in light or heavy syrup. Fruit juice. Meat and Other Protein Products Fatty cuts of meat. Ribs, chicken wings, bacon, sausage, bologna, salami, chitterlings, fatback, hot dogs, bratwurst, and packaged luncheon meats. Dairy Whole or 2% milk, cream, half-and-half, and cream cheese. Whole-fat or sweetened yogurt. Full-fat cheeses. Nondairy creamers and whipped toppings. Processed cheese, cheese spreads, or cheese curds. Sweets and Desserts Corn syrup, sugars, honey, and molasses. Candy. Jam and jelly. Syrup. Sweetened cereals. Cookies, pies, cakes, donuts, muffins, and ice cream. Fats and Oils Butter, stick margarine, lard, shortening, ghee, or bacon fat. Coconut, palm kernel, or palm oils. Beverages Alcohol. Sweetened drinks (such as sodas, lemonade, and fruit drinks or punches). The items listed above may not be a complete list of foods and  beverages to avoid. Contact your dietitian for more information. Document Released: 08/25/2004 Document Revised: 11/12/2013 Document Reviewed: 09/11/2013 [facility] Patient Information 2015 [place], [place]. This information is not intended to replace advice given to you by your health care provider. Make sure you discuss any questions you have with your health care provider.

## 2014-10-23 ENCOUNTER — Encounter: Payer: Self-pay | Admitting: Family Medicine

## 2014-10-23 LAB — URINALYSIS, COMPLETE
BACTERIA UA: NONE SEEN
BILIRUBIN URINE: NEGATIVE
Casts: NONE SEEN
Crystals: NONE SEEN
GLUCOSE, UA: NEGATIVE mg/dL
HGB URINE DIPSTICK: NEGATIVE
KETONES UR: NEGATIVE mg/dL
Leukocytes, UA: NEGATIVE
Nitrite: NEGATIVE
Protein, ur: NEGATIVE mg/dL
SQUAMOUS EPITHELIAL / LPF: NONE SEEN
Specific Gravity, Urine: 1.006 (ref 1.005–1.030)
UROBILINOGEN UA: 0.2 mg/dL (ref 0.0–1.0)
pH: 6 (ref 5.0–8.0)

## 2014-10-27 LAB — CYTOLOGY - PAP

## 2015-05-11 ENCOUNTER — Ambulatory Visit (INDEPENDENT_AMBULATORY_CARE_PROVIDER_SITE_OTHER): Payer: BC Managed Care – PPO | Admitting: Family Medicine

## 2015-05-11 VITALS — BP 138/79 | HR 102 | Temp 98.3°F | Resp 16 | Ht 66.0 in | Wt 166.0 lb

## 2015-05-11 DIAGNOSIS — R0982 Postnasal drip: Secondary | ICD-10-CM

## 2015-05-11 DIAGNOSIS — H9319 Tinnitus, unspecified ear: Secondary | ICD-10-CM

## 2015-05-11 DIAGNOSIS — R05 Cough: Secondary | ICD-10-CM

## 2015-05-11 DIAGNOSIS — R058 Other specified cough: Secondary | ICD-10-CM

## 2015-05-11 MED ORDER — CETIRIZINE HCL 10 MG PO TABS: 10.0000 mg | ORAL_TABLET | Freq: Every day | ORAL | Status: DC

## 2015-05-11 NOTE — Patient Instructions (Signed)
Cough, Adult  A cough is a reflex that helps clear your throat and airways. It can help heal the body or may be a reaction to an irritated airway. A cough may only last 2 or 3 weeks (acute) or may last more than 8 weeks (chronic).  CAUSES Acute cough:  Viral or bacterial infections. Chronic cough:  Infections.  Allergies.  Asthma.  Post-nasal drip.  Smoking.  Heartburn or acid reflux.  Some medicines.  Chronic lung problems (COPD).  Cancer. SYMPTOMS   Cough.  Fever.  Chest pain.  Increased breathing rate.  High-pitched whistling sound when breathing (wheezing).  Colored mucus that you cough up (sputum). TREATMENT   A bacterial cough may be treated with antibiotic medicine.  A viral cough must run its course and will not respond to antibiotics.  Your caregiver may recommend other treatments if you have a chronic cough. HOME CARE INSTRUCTIONS   Only take over-the-counter or prescription medicines for pain, discomfort, or fever as directed by your caregiver. Use cough suppressants only as directed by your caregiver.  Use a cold steam vaporizer or humidifier in your bedroom or home to help loosen secretions.  Sleep in a semi-upright position if your cough is worse at night.  Rest as needed.  Stop smoking if you smoke. SEEK IMMEDIATE MEDICAL CARE IF:   You have pus in your sputum.  Your cough starts to worsen.  You cannot control your cough with suppressants and are losing sleep.  You begin coughing up blood.  You have difficulty breathing.  You develop pain which is getting worse or is uncontrolled with medicine.  You have a fever. MAKE SURE YOU:   Understand these instructions.  Will watch your condition.  Will get help right away if you are not doing well or get worse. Document Released: 05/06/2011 Document Revised: 01/30/2012 Document Reviewed: 05/06/2011 [employer] Patient Information 2015 [employer]. This information is not intended  to replace advice given to you by your health care provider. Make sure you discuss any questions you have with your health care provider.  

## 2015-05-11 NOTE — Progress Notes (Signed)
Subjective:    Patient ID: Lorraine Blair, female    DOB: 10-Feb-1951, 64 y.o.   MRN: [id number]  HPI [name], a 64 year old patient presents with a cough and occasional ear ringing.Patient complains of nonproductive cough.  Symptoms began several weeks ago.  The cough is described as intermittent and non productive. She denies dyspnea, wheezing, hemoptysis, fever, or chest pains. [name] maintains that the cough is aggravated by dry environments.  Patient does not have new pets. Patient does not have a history of asthma. Patient has a history of environmental allergens. Patient does not have a history of recent travel or smoking.  Patient  previous Chest X-ray.   Past Medical History  Diagnosis Date  . Allergic rhinitis due to pollen   . Pure hypercholesterolemia   . Neuromuscular disorder   . Carpal tunnel syndrome    History   Social History  . Marital Status: Single    Spouse Name: N/A  . Number of Children: N/A  . Years of Education: N/A   Occupational History  . Not on file.   Social History Main Topics  . Smoking status: Former Smoker    Types: Cigarettes    Quit date: 06/24/1988  . Smokeless tobacco: Never Used  . Alcohol Use: 0.6 oz/week    1 Glasses of wine per week  . Drug Use: No  . Sexual Activity: No   Other Topics Concern  . Not on file   Social History Narrative  No Known Allergies   Review of Systems  Constitutional: Negative.   HENT: Negative.   Eyes: Negative.   Respiratory: Negative.   Cardiovascular: Negative.   Gastrointestinal: Negative.   Endocrine: Negative.   Genitourinary: Negative.   Musculoskeletal: Negative.   Skin: Negative.   Allergic/Immunologic: Negative for environmental allergies, food allergies and immunocompromised state.  Neurological: Negative.   Hematological: Negative.         Objective:   Physical Exam  Constitutional: She is oriented to person, place, and time. She appears well-developed and  well-nourished.  HENT:  Head: Normocephalic and atraumatic.  Right Ear: External ear normal.  Left Ear: External ear normal.  Nose: Nose normal.  Mouth/Throat: Oropharynx is clear and moist.  Eyes: Conjunctivae and EOM are normal. Pupils are equal, round, and reactive to light.  Neck: Normal range of motion. Neck supple.  Cardiovascular: Normal rate, regular rhythm, normal heart sounds and intact distal pulses.   Pulmonary/Chest: Effort normal and breath sounds normal.  Abdominal: Soft. Bowel sounds are normal.  Musculoskeletal: Normal range of motion.  Neurological: She is alert and oriented to person, place, and time. She has normal reflexes.  Skin: Skin is warm and dry.  Psychiatric: She has a normal mood and affect. Her behavior is normal. Judgment and thought content normal.         BP 138/79 mmHg  Pulse 102  Temp(Src) 98.3 F (36.8 C) (Oral)  Resp 16  Ht 5\' 6"  (1.676 m)  Wt 166 lb (75.297 kg)  BMI 26.81 kg/m2 Assessment & Plan:  1. Allergic cough Patient has clear post nasal drainage on physical exam. Will start daily antihistamine for allergic cough. - cetirizine (ZYRTEC) 10 MG tablet; Take 1 tablet (10 mg total) by mouth daily.  Dispense: 30 tablet; Refill: 1  2. Post-nasal drip Will start daily antihistamine.   3. Tinnitus, unspecified laterality Patient reports occasional ringing in ears, clear drainage present behind TM. I suspect that tinnitus is allergic, will  start antihistamine.   RTC: as previously scheduled.   Dorena Dew, FNP

## 2015-05-11 NOTE — Progress Notes (Signed)
   Subjective:    Patient ID: Lorraine Blair, female    DOB: [birth year], 64 y.o.   MRN: [id number]  HPI    Review of Systems     Objective:   Physical Exam        Assessment & Plan:

## 2015-05-19 ENCOUNTER — Encounter: Payer: Self-pay | Admitting: Family Medicine

## 2015-05-19 DIAGNOSIS — R058 Other specified cough: Secondary | ICD-10-CM | POA: Insufficient documentation

## 2015-05-19 DIAGNOSIS — R05 Cough: Secondary | ICD-10-CM | POA: Insufficient documentation

## 2015-05-19 DIAGNOSIS — R0982 Postnasal drip: Secondary | ICD-10-CM | POA: Insufficient documentation

## 2015-06-17 ENCOUNTER — Encounter: Payer: Self-pay | Admitting: *Deleted

## 2015-10-27 ENCOUNTER — Ambulatory Visit (INDEPENDENT_AMBULATORY_CARE_PROVIDER_SITE_OTHER): Payer: BC Managed Care – PPO | Admitting: Family Medicine

## 2015-10-27 ENCOUNTER — Other Ambulatory Visit: Payer: Self-pay | Admitting: Family Medicine

## 2015-10-27 VITALS — BP 130/82 | HR 72 | Temp 98.0°F | Resp 16 | Ht 66.0 in | Wt 168.0 lb

## 2015-10-27 DIAGNOSIS — Z23 Encounter for immunization: Secondary | ICD-10-CM | POA: Diagnosis not present

## 2015-10-27 DIAGNOSIS — J0100 Acute maxillary sinusitis, unspecified: Secondary | ICD-10-CM

## 2015-10-27 DIAGNOSIS — Z0000 Encounter for general adult medical examination without abnormal findings: Secondary | ICD-10-CM

## 2015-10-27 DIAGNOSIS — J3489 Other specified disorders of nose and nasal sinuses: Secondary | ICD-10-CM

## 2015-10-27 LAB — CBC WITH DIFFERENTIAL/PLATELET
BASOS ABS: 0 10*3/uL (ref 0.0–0.1)
Basophils Relative: 0 % (ref 0–1)
Eosinophils Absolute: 0.1 10*3/uL (ref 0.0–0.7)
Eosinophils Relative: 2 % (ref 0–5)
HEMATOCRIT: 37.7 % (ref 36.0–46.0)
HEMOGLOBIN: 12.7 g/dL (ref 12.0–15.0)
LYMPHS PCT: 53 % — AB (ref 12–46)
Lymphs Abs: 2.4 10*3/uL (ref 0.7–4.0)
MCH: 29.9 pg (ref 26.0–34.0)
MCHC: 33.7 g/dL (ref 30.0–36.0)
MCV: 88.7 fL (ref 78.0–100.0)
MPV: 8.9 fL (ref 8.6–12.4)
Monocytes Absolute: 0.3 10*3/uL (ref 0.1–1.0)
Monocytes Relative: 6 % (ref 3–12)
NEUTROS ABS: 1.8 10*3/uL (ref 1.7–7.7)
Neutrophils Relative %: 39 % — ABNORMAL LOW (ref 43–77)
Platelets: 252 10*3/uL (ref 150–400)
RBC: 4.25 MIL/uL (ref 3.87–5.11)
RDW: 15.7 % — ABNORMAL HIGH (ref 11.5–15.5)
WBC: 4.5 10*3/uL (ref 4.0–10.5)

## 2015-10-27 LAB — COMPLETE METABOLIC PANEL WITH GFR
ALT: 13 U/L (ref 6–29)
AST: 21 U/L (ref 10–35)
Albumin: 4.2 g/dL (ref 3.6–5.1)
Alkaline Phosphatase: 93 U/L (ref 33–130)
BUN: 17 mg/dL (ref 7–25)
CALCIUM: 9.5 mg/dL (ref 8.6–10.4)
CHLORIDE: 100 mmol/L (ref 98–110)
CO2: 29 mmol/L (ref 20–31)
Creat: 0.85 mg/dL (ref 0.50–0.99)
GFR, EST AFRICAN AMERICAN: 84 mL/min (ref >=60)
GFR, Est Non African American: 73 mL/min (ref >=60)
Glucose, Bld: 79 mg/dL (ref 65–99)
POTASSIUM: 4.1 mmol/L (ref 3.5–5.3)
Sodium: 138 mmol/L (ref 135–146)
Total Bilirubin: 0.4 mg/dL (ref 0.2–1.2)
Total Protein: 8.4 g/dL — ABNORMAL HIGH (ref 6.1–8.1)

## 2015-10-27 LAB — POCT URINALYSIS DIP (DEVICE)
BILIRUBIN URINE: NEGATIVE
GLUCOSE, UA: NEGATIVE mg/dL
HGB URINE DIPSTICK: NEGATIVE
Ketones, ur: NEGATIVE mg/dL
LEUKOCYTES UA: NEGATIVE
Nitrite: NEGATIVE
Protein, ur: NEGATIVE mg/dL
Specific Gravity, Urine: 1.015 (ref 1.005–1.030)
UROBILINOGEN UA: 0.2 mg/dL (ref 0.0–1.0)
pH: 5.5 (ref 5.0–8.0)

## 2015-10-27 MED ORDER — AMOXICILLIN-POT CLAVULANATE 875-125 MG PO TABS: 1.0000 | ORAL_TABLET | Freq: Two times a day (BID) | ORAL | Status: DC

## 2015-10-27 MED ORDER — CHLORPHEN-PE-ACETAMINOPHEN 4-10-325 MG PO TABS: 1.0000 | ORAL_TABLET | Freq: Four times a day (QID) | ORAL | Status: DC | PRN

## 2015-10-27 NOTE — Progress Notes (Signed)
Subjective:    Patient ID: Lorraine Blair, female    DOB: 12/09/1950, 64 y.o.   MRN: [id number]  HPI Ms. Rondalyn Bero, a 64 year old female presents for an annual physical examination. Ms. Sholl maintains that she feels well and is without complaints. Patient follows a balanced diet and exercises 2-3 times per week. She reports that she has yearly eye examinations and typically goes to the dentist twice yearly.  Patient is up to date with mammogram and colonoscopy. Last pap smear was in December 2015. She is post menopausal without symptoms. She denies headache, chest pain, shortness of breath, dysuria, muscle aches, nausea, vomiting, diarrhea, or constipation.  Past Medical History  Diagnosis Date  . Allergic rhinitis due to pollen   . Pure hypercholesterolemia   . Neuromuscular disorder   . Carpal tunnel syndrome   No Known Allergies Immunization History  Administered Date(s) Administered  . Influenza,inj,Quad PF,36+ Mos 10/22/2014  . Pneumococcal Polysaccharide-23 07/17/2014  . Tdap 07/17/2014  . Zoster 10/22/2014   Social History   Social History  . Marital Status: Single    Spouse Name: N/A  . Number of Children: N/A  . Years of Education: N/A   Occupational History  . Not on file.   Social History Main Topics  . Smoking status: Former Smoker    Types: Cigarettes    Quit date: 06/24/1988  . Smokeless tobacco: Never Used  . Alcohol Use: 0.6 oz/week    1 Glasses of wine per week  . Drug Use: No  . Sexual Activity: No   Other Topics Concern  . Not on file   Social History Narrative   Review of Systems  Constitutional: Negative.  Negative for fever, fatigue and unexpected weight change.  HENT: Negative.   Eyes: Negative.   Respiratory: Negative.  Negative for apnea, cough, choking, chest tightness, wheezing and stridor.   Cardiovascular: Negative.   Gastrointestinal: Negative.   Endocrine: Negative for polydipsia, polyphagia and polyuria.   Genitourinary: Negative.   Musculoskeletal: Negative.   Skin: Negative.   Allergic/Immunologic: Negative.   Neurological: Negative.   Hematological: Negative.   Psychiatric/Behavioral: Negative.  Negative for suicidal ideas and sleep disturbance.       Objective:   Physical Exam  Constitutional: She is oriented to person, place, and time. She appears well-developed and well-nourished. She is active.  HENT:  Head: Normocephalic and atraumatic.  Right Ear: Hearing, tympanic membrane and external ear normal.  Left Ear: Hearing, external ear and ear canal normal. Tympanic membrane is erythematous.  Nose: Right sinus exhibits maxillary sinus tenderness.  Mouth/Throat: Uvula is midline. No dental abscesses (mild swelling to right upper gums). Posterior oropharyngeal erythema present.  Eyes: Conjunctivae and EOM are normal. Pupils are equal, round, and reactive to light.  Neck: Normal range of motion. Neck supple.  Cardiovascular: Normal rate, regular rhythm, normal heart sounds and intact distal pulses.   Musculoskeletal: Normal range of motion.  Neurological: She is alert and oriented to person, place, and time. She has normal reflexes. She displays normal reflexes. No cranial nerve deficit. She exhibits normal muscle tone. Coordination normal.  Skin: Skin is warm and dry. No rash noted. No pallor.  Psychiatric: She has a normal mood and affect. Her behavior is normal. Judgment and thought content normal.      BP 130/82 mmHg  Pulse 72  Temp(Src) 98 F (36.7 C) (Oral)  Resp 16  Ht 5\' 6"  (1.676 m)  Wt 168 lb (76.204 kg)  BMI 27.13 kg/m2 Assessment & Plan:  1. Annual physical exam Recommend yearly eye exam.  Recommend dental exams twice yearly Will send a referral for a bone density test Recommend yearly mammogram Will repeat pap smear in 1 year Discussed advanced directives, given information Reviewed EKG, within normal limits Will follow up with laboratory results by  phone Recommend a lowfat, low carbohydrate diet divided over 5-6 small meals, increase water intake to 6-8 glasses, and 150 minutes per week of cardiovascular exercise.  Last colonoscopy was in October 2015 - EKG 12-Lead - COMPLETE METABOLIC PANEL WITH GFR - CBC with Differential - TSH - Hemoglobin A1c - POCT urinalysis dipstick  2. Need for immunization against influenza  - Flu Vaccine QUAD 36+ mos IM (Fluarix)   Hollis,Lachina M, FNP

## 2015-10-28 ENCOUNTER — Other Ambulatory Visit: Payer: Self-pay | Admitting: Family Medicine

## 2015-10-28 DIAGNOSIS — R7303 Prediabetes: Secondary | ICD-10-CM | POA: Insufficient documentation

## 2015-10-28 LAB — HEMOGLOBIN A1C
HEMOGLOBIN A1C: 6 % — AB (ref <5.7)
MEAN PLASMA GLUCOSE: 126 mg/dL — AB (ref <117)

## 2015-10-28 LAB — TSH: TSH: 2.163 u[IU]/mL (ref 0.350–4.500)

## 2015-10-30 ENCOUNTER — Encounter: Payer: Self-pay | Admitting: Family Medicine

## 2015-10-30 NOTE — Patient Instructions (Signed)
Health Maintenance, Female Adopting a healthy lifestyle and getting preventive care can go a long way to promote health and wellness. Talk with your health care provider about what schedule of regular examinations is right for you. This is a good chance for you to check in with your provider about disease prevention and staying healthy. In between checkups, there are plenty of things you can do on your own. Experts have done a lot of research about which lifestyle changes and preventive measures are most likely to keep you healthy. Ask your health care provider for more information. WEIGHT AND DIET  Eat a healthy diet  Be sure to include plenty of vegetables, fruits, low-fat dairy products, and lean protein.  Do not eat a lot of foods high in solid fats, added sugars, or salt.  Get regular exercise. This is one of the most important things you can do for your health.  Most adults should exercise for at least 150 minutes each week. The exercise should increase your heart rate and make you sweat (moderate-intensity exercise).  Most adults should also do strengthening exercises at least twice a week. This is in addition to the moderate-intensity exercise.  Maintain a healthy weight  Body mass index (BMI) is a measurement that can be used to identify possible weight problems. It estimates body fat based on height and weight. Your health care provider can help determine your BMI and help you achieve or maintain a healthy weight.  For females 20 years of age and older:   A BMI below 18.5 is considered underweight.  A BMI of 18.5 to 24.9 is normal.  A BMI of 25 to 29.9 is considered overweight.  A BMI of 30 and above is considered obese.  Watch levels of cholesterol and blood lipids  You should start having your blood tested for lipids and cholesterol at 64 years of age, then have this test every 5 years.  You may need to have your cholesterol levels checked more often if:  Your lipid  or cholesterol levels are high.  You are older than 64 years of age.  You are at high risk for heart disease.  CANCER SCREENING   Lung Cancer  Lung cancer screening is recommended for adults 55-80 years old who are at high risk for lung cancer because of a history of smoking.  A yearly low-dose CT scan of the lungs is recommended for people who:  Currently smoke.  Have quit within the past 15 years.  Have at least a 30-pack-year history of smoking. A pack year is smoking an average of one pack of cigarettes a day for 1 year.  Yearly screening should continue until it has been 15 years since you quit.  Yearly screening should stop if you develop a health problem that would prevent you from having lung cancer treatment.  Breast Cancer  Practice breast self-awareness. This means understanding how your breasts normally appear and feel.  It also means doing regular breast self-exams. Let your health care provider know about any changes, no matter how small.  If you are in your 20s or 30s, you should have a clinical breast exam (CBE) by a health care provider every 1-3 years as part of a regular health exam.  If you are 40 or older, have a CBE every year. Also consider having a breast X-ray (mammogram) every year.  If you have a family history of breast cancer, talk to your health care provider about genetic screening.  If you   are at high risk for breast cancer, talk to your health care provider about having an MRI and a mammogram every year.  Breast cancer gene (BRCA) assessment is recommended for women who have family members with BRCA-related cancers. BRCA-related cancers include:  Breast.  Ovarian.  Tubal.  Peritoneal cancers.  Results of the assessment will determine the need for genetic counseling and BRCA1 and BRCA2 testing. Cervical Cancer Your health care provider may recommend that you be screened regularly for cancer of the pelvic organs (ovaries, uterus, and  vagina). This screening involves a pelvic examination, including checking for microscopic changes to the surface of your cervix (Pap test). You may be encouraged to have this screening done every 3 years, beginning at age 21.  For women ages 30-65, health care providers may recommend pelvic exams and Pap testing every 3 years, or they may recommend the Pap and pelvic exam, combined with testing for human papilloma virus (HPV), every 5 years. Some types of HPV increase your risk of cervical cancer. Testing for HPV may also be done on women of any age with unclear Pap test results.  Other health care providers may not recommend any screening for nonpregnant women who are considered low risk for pelvic cancer and who do not have symptoms. Ask your health care provider if a screening pelvic exam is right for you.  If you have had past treatment for cervical cancer or a condition that could lead to cancer, you need Pap tests and screening for cancer for at least 20 years after your treatment. If Pap tests have been discontinued, your risk factors (such as having a new sexual partner) need to be reassessed to determine if screening should resume. Some women have medical problems that increase the chance of getting cervical cancer. In these cases, your health care provider may recommend more frequent screening and Pap tests. Colorectal Cancer  This type of cancer can be detected and often prevented.  Routine colorectal cancer screening usually begins at 64 years of age and continues through 64 years of age.  Your health care provider may recommend screening at an earlier age if you have risk factors for colon cancer.  Your health care provider may also recommend using home test kits to check for hidden blood in the stool.  A small camera at the end of a tube can be used to examine your colon directly (sigmoidoscopy or colonoscopy). This is done to check for the earliest forms of colorectal  cancer.  Routine screening usually begins at age 50.  Direct examination of the colon should be repeated every 5-10 years through 64 years of age. However, you may need to be screened more often if early forms of precancerous polyps or small growths are found. Skin Cancer  Check your skin from head to toe regularly.  Tell your health care provider about any new moles or changes in moles, especially if there is a change in a mole's shape or color.  Also tell your health care provider if you have a mole that is larger than the size of a pencil eraser.  Always use sunscreen. Apply sunscreen liberally and repeatedly throughout the day.  Protect yourself by wearing long sleeves, pants, a wide-brimmed hat, and sunglasses whenever you are outside. HEART DISEASE, DIABETES, AND HIGH BLOOD PRESSURE   High blood pressure causes heart disease and increases the risk of stroke. High blood pressure is more likely to develop in:  People who have blood pressure in the high end   of the normal range (130-139/85-89 mm Hg).  People who are overweight or obese.  People who are African American.  If you are 38-23 years of age, have your blood pressure checked every 3-5 years. If you are 61 years of age or older, have your blood pressure checked every year. You should have your blood pressure measured twice--once when you are at a hospital or clinic, and once when you are not at a hospital or clinic. Record the average of the two measurements. To check your blood pressure when you are not at a hospital or clinic, you can use:  An automated blood pressure machine at a pharmacy.  A home blood pressure monitor.  If you are between 45 years and 39 years old, ask your health care provider if you should take aspirin to prevent strokes.  Have regular diabetes screenings. This involves taking a blood sample to check your fasting blood sugar level.  If you are at a normal weight and have a low risk for diabetes,  have this test once every three years after 64 years of age.  If you are overweight and have a high risk for diabetes, consider being tested at a younger age or more often. PREVENTING INFECTION  Hepatitis B  If you have a higher risk for hepatitis B, you should be screened for this virus. You are considered at high risk for hepatitis B if:  You were born in a country where hepatitis B is common. Ask your health care provider which countries are considered high risk.  Your parents were born in a high-risk country, and you have not been immunized against hepatitis B (hepatitis B vaccine).  You have HIV or AIDS.  You use needles to inject street drugs.  You live with someone who has hepatitis B.  You have had sex with someone who has hepatitis B.  You get hemodialysis treatment.  You take certain medicines for conditions, including cancer, organ transplantation, and autoimmune conditions. Hepatitis C  Blood testing is recommended for:  Everyone born from [birth year] through 1965.  Anyone with known risk factors for hepatitis C. Sexually transmitted infections (STIs)  You should be screened for sexually transmitted infections (STIs) including gonorrhea and chlamydia if:  You are sexually active and are younger than 64 years of age.  You are older than 64 years of age and your health care provider tells you that you are at risk for this type of infection.  Your sexual activity has changed since you were last screened and you are at an increased risk for chlamydia or gonorrhea. Ask your health care provider if you are at risk.  If you do not have HIV, but are at risk, it may be recommended that you take a prescription medicine daily to prevent HIV infection. This is called pre-exposure prophylaxis (PrEP). You are considered at risk if:  You are sexually active and do not regularly use condoms or know the HIV status of your partner(s).  You take drugs by injection.  You are sexually  active with a partner who has HIV. Talk with your health care provider about whether you are at high risk of being infected with HIV. If you choose to begin PrEP, you should first be tested for HIV. You should then be tested every 3 months for as long as you are taking PrEP.  PREGNANCY   If you are premenopausal and you may become pregnant, ask your health care provider about preconception counseling.  If you may  become pregnant, take 400 to 800 micrograms (mcg) of folic acid every day.  If you want to prevent pregnancy, talk to your health care provider about birth control (contraception). OSTEOPOROSIS AND MENOPAUSE   Osteoporosis is a disease in which the bones lose minerals and strength with aging. This can result in serious bone fractures. Your risk for osteoporosis can be identified using a bone density scan.  If you are 61 years of age or older, or if you are at risk for osteoporosis and fractures, ask your health care provider if you should be screened.  Ask your health care provider whether you should take a calcium or vitamin D supplement to lower your risk for osteoporosis.  Menopause may have certain physical symptoms and risks.  Hormone replacement therapy may reduce some of these symptoms and risks. Talk to your health care provider about whether hormone replacement therapy is right for you.  HOME CARE INSTRUCTIONS   Schedule regular health, dental, and eye exams.  Stay current with your immunizations.   Do not use any tobacco products including cigarettes, chewing tobacco, or electronic cigarettes.  If you are pregnant, do not drink alcohol.  If you are breastfeeding, limit how much and how often you drink alcohol.  Limit alcohol intake to no more than 1 drink per day for nonpregnant women. One drink equals 12 ounces of beer, 5 ounces of wine, or 1 ounces of hard liquor.  Do not use street drugs.  Do not share needles.  Ask your health care provider for help if  you need support or information about quitting drugs.  Tell your health care provider if you often feel depressed.  Tell your health care provider if you have ever been abused or do not feel safe at home.   This information is not intended to replace advice given to you by your health care provider. Make sure you discuss any questions you have with your health care provider.   Document Released: 05/23/2011 Document Revised: 11/28/2014 Document Reviewed: 10/09/2013 Elsevier Interactive Patient Education [employer].

## 2016-02-09 ENCOUNTER — Ambulatory Visit (INDEPENDENT_AMBULATORY_CARE_PROVIDER_SITE_OTHER): Payer: Medicare Other | Admitting: Family Medicine

## 2016-02-09 ENCOUNTER — Encounter: Payer: Self-pay | Admitting: Family Medicine

## 2016-02-09 VITALS — BP 125/79 | HR 85 | Temp 98.5°F | Resp 16 | Ht 66.0 in | Wt 164.0 lb

## 2016-02-09 DIAGNOSIS — K219 Gastro-esophageal reflux disease without esophagitis: Secondary | ICD-10-CM | POA: Diagnosis not present

## 2016-02-09 DIAGNOSIS — R05 Cough: Secondary | ICD-10-CM

## 2016-02-09 DIAGNOSIS — R059 Cough, unspecified: Secondary | ICD-10-CM

## 2016-02-09 MED ORDER — OMEPRAZOLE 20 MG PO CPDR: 20.0000 mg | DELAYED_RELEASE_CAPSULE | Freq: Every day | ORAL | Status: DC

## 2016-02-09 NOTE — Patient Instructions (Signed)
Gastroesophageal Reflux Disease, Adult Normally, food travels down the esophagus and stays in the stomach to be digested. If a person has gastroesophageal reflux disease (GERD), food and stomach acid move back up into the esophagus. When this happens, the esophagus becomes sore and swollen (inflamed). Over time, GERD can make small holes (ulcers) in the lining of the esophagus. HOME CARE Diet  Follow a diet as told by your doctor. You may need to avoid foods and drinks such as:  Coffee and tea (with or without caffeine).  Drinks that contain alcohol.  Energy drinks and sports drinks.  Carbonated drinks or sodas.  Chocolate and cocoa.  Peppermint and mint flavorings.  Garlic and onions.  Horseradish.  Spicy and acidic foods, such as peppers, chili powder, curry powder, vinegar, hot sauces, and BBQ sauce.  Citrus fruit juices and citrus fruits, such as oranges, lemons, and limes.  Tomato-based foods, such as red sauce, chili, salsa, and pizza with red sauce.  Fried and fatty foods, such as donuts, french fries, potato chips, and high-fat dressings.  High-fat meats, such as hot dogs, rib eye steak, sausage, ham, and bacon.  High-fat dairy items, such as whole milk, butter, and cream cheese.  Eat small meals often. Avoid eating large meals.  Avoid drinking large amounts of liquid with your meals.  Avoid eating meals during the 2-3 hours before bedtime.  Avoid lying down right after you eat.  Do not exercise right after you eat. General Instructions  Pay attention to any changes in your symptoms.  Take over-the-counter and prescription medicines only as told by your doctor. Do not take aspirin, ibuprofen, or other NSAIDs unless your doctor says it is okay.  Do not use any tobacco products, including cigarettes, chewing tobacco, and e-cigarettes. If you need help quitting, ask your doctor.  Wear loose clothes. Do not wear anything tight around your waist.  Raise  (elevate) the head of your bed about 6 inches (15 cm).  Try to lower your stress. If you need help doing this, ask your doctor.  If you are overweight, lose an amount of weight that is healthy for you. Ask your doctor about a safe weight loss goal.  Keep all follow-up visits as told by your doctor. This is important. GET HELP IF:  You have new symptoms.  You lose weight and you do not know why it is happening.  You have trouble swallowing, or it hurts to swallow.  You have wheezing or a cough that keeps happening.  Your symptoms do not get better with treatment.  You have a hoarse voice. GET HELP RIGHT AWAY IF:  You have pain in your arms, neck, jaw, teeth, or back.  You feel sweaty, dizzy, or light-headed.  You have chest pain or shortness of breath.  You throw up (vomit) and your throw up looks like blood or coffee grounds.  You pass out (faint).  Your poop (stool) is bloody or black.  You cannot swallow, drink, or eat.   This information is not intended to replace advice given to you by your health care provider. Make sure you discuss any questions you have with your health care provider.   Document Released: 04/25/2008 Document Revised: 07/29/2015 Document Reviewed: 03/04/2015 Elsevier Interactive Patient Education 2016 [place] for Gastroesophageal Reflux Disease, Adult When you have gastroesophageal reflux disease (GERD), the foods you eat and your eating habits are very important. Choosing the right foods can help ease the discomfort of GERD. WHAT GENERAL  GUIDELINES DO I NEED TO FOLLOW?  Choose fruits, vegetables, whole grains, low-fat dairy products, and low-fat meat, fish, and poultry.  Limit fats such as oils, salad dressings, butter, nuts, and avocado.  Keep a food diary to identify foods that cause symptoms.  Avoid foods that cause reflux. These may be different for different people.  Eat frequent small meals instead of three large  meals each day.  Eat your meals slowly, in a relaxed setting.  Limit fried foods.  Cook foods using methods other than frying.  Avoid drinking alcohol.  Avoid drinking large amounts of liquids with your meals.  Avoid bending over or lying down until 2-3 hours after eating. WHAT FOODS ARE NOT RECOMMENDED? The following are some foods and drinks that may worsen your symptoms: Vegetables Tomatoes. Tomato juice. Tomato and spaghetti sauce. Chili peppers. Onion and garlic. Horseradish. Fruits Oranges, grapefruit, and lemon (fruit and juice). Meats High-fat meats, fish, and poultry. This includes hot dogs, ribs, ham, sausage, salami, and bacon. Dairy Whole milk and chocolate milk. Sour cream. Cream. Butter. Ice cream. Cream cheese.  Beverages Coffee and tea, with or without caffeine. Carbonated beverages or energy drinks. Condiments Hot sauce. Barbecue sauce.  Sweets/Desserts Chocolate and cocoa. Donuts. Peppermint and spearmint. Fats and Oils High-fat foods, including [place] fries and potato chips. Other Vinegar. Strong spices, such as black pepper, white pepper, red pepper, cayenne, curry powder, cloves, ginger, and chili powder. The items listed above may not be a complete list of foods and beverages to avoid. Contact your dietitian for more information.   This information is not intended to replace advice given to you by your health care provider. Make sure you discuss any questions you have with your health care provider.   Document Released: 11/07/2005 Document Revised: 11/28/2014 Document Reviewed: 09/11/2013 Elsevier Interactive Patient Education [employer].

## 2016-02-09 NOTE — Progress Notes (Signed)
Subjective:    Patient ID: Lorraine Blair, female    DOB: Jun 03, 1951, 65 y.o.   MRN: [id number]  HPI  [name], a 65 year old female that presents complaining of persistent cough. Patient has had cough for greater than 1 year.   She denies belching, chest pain, cough, deep pressure at base of neck, difficulty swallowing, early satiety, heartburn, laryngitis, midespigastric pain, shortness of breath, unexpected weight loss and upper abdominal discomfort. Symptoms have been present for greater than 1 year. She denies dysphagia.  She has not lost weight. She denies melena, hematochezia, hematemesis, and coffee ground emesis. Medical therapy in the past has included anti-histamines.  Past Medical History  Diagnosis Date  . Allergic rhinitis due to pollen   . Pure hypercholesterolemia   . Neuromuscular disorder ([place])   . Carpal tunnel syndrome    Past Surgical History  Procedure Laterality Date  . Tubal ligation  1989   Social History   Social History  . Marital Status: Single    Spouse Name: N/A  . Number of Children: N/A  . Years of Education: N/A   Occupational History  . Not on file.   Social History Main Topics  . Smoking status: Former Smoker    Types: Cigarettes    Quit date: 06/24/1988  . Smokeless tobacco: Never Used  . Alcohol Use: 0.6 oz/week    1 Glasses of wine per week  . Drug Use: No  . Sexual Activity: No   Other Topics Concern  . Not on file   Social History Narrative   Review of Systems  Constitutional: Negative.   HENT: Negative.   Eyes: Negative.   Respiratory: Positive for cough.   Cardiovascular: Negative.   Gastrointestinal: Negative.   Endocrine: Negative.   Genitourinary: Negative.   Musculoskeletal: Negative.   Skin: Negative.   Allergic/Immunologic: Negative.   Neurological: Negative.   Hematological: Negative.   Psychiatric/Behavioral: Negative.         Objective:   Physical Exam  Constitutional: She is oriented  to person, place, and time. She appears well-developed and well-nourished.  HENT:  Head: Normocephalic and atraumatic.  Right Ear: External ear normal.  Left Ear: External ear normal.  Nose: Nose normal.  Mouth/Throat: Oropharynx is clear and moist.  Eyes: Conjunctivae and EOM are normal. Pupils are equal, round, and reactive to light.  Neck: Normal range of motion. Neck supple.  Cardiovascular: Normal rate, regular rhythm, normal heart sounds and intact distal pulses.   Pulmonary/Chest: Effort normal and breath sounds normal.  Abdominal: Soft. Bowel sounds are normal.  Musculoskeletal: Normal range of motion.  Neurological: She is alert and oriented to person, place, and time. She has normal reflexes.  Skin: Skin is warm and dry.  Psychiatric: She has a normal mood and affect. Her behavior is normal. Judgment and thought content normal.      BP 125/79 mmHg  Pulse 85  Temp(Src) 98.5 F (36.9 C) (Oral)  Resp 16  Ht 5\' 6"  (1.676 m)  Wt 164 lb (74.39 kg)  BMI 26.48 kg/m2  SpO2 98% Assessment & Plan:  1. Gastroesophageal reflux disease without esophagitis Will start a trial of Omeprazole 20 mg daily. Discussed diet at length. Reminded patient of foods to avoid including coffee, tea, energy drinks, chocolate, peppermint, garlic, spicy and acidic food.   The patient is asked to make an attempt to improve diet and exercise patterns to aid in medical management of this problem.  - omeprazole (PRILOSEC)  20 MG capsule; Take 1 capsule (20 mg total) by mouth daily.  Dispense: 30 capsule; Refill: 3  2. Cough Refer to #1   RTC: 1 month for GERD   Maddisen Vought M, FNP

## 2016-03-15 ENCOUNTER — Ambulatory Visit (INDEPENDENT_AMBULATORY_CARE_PROVIDER_SITE_OTHER): Payer: Medicare Other | Admitting: Family Medicine

## 2016-03-15 ENCOUNTER — Encounter: Payer: Self-pay | Admitting: Family Medicine

## 2016-03-15 VITALS — BP 128/80 | HR 68 | Temp 97.7°F | Resp 14 | Ht 66.0 in | Wt 164.0 lb

## 2016-03-15 DIAGNOSIS — R05 Cough: Secondary | ICD-10-CM | POA: Diagnosis not present

## 2016-03-15 DIAGNOSIS — R058 Other specified cough: Secondary | ICD-10-CM

## 2016-03-15 DIAGNOSIS — R0982 Postnasal drip: Secondary | ICD-10-CM

## 2016-03-15 MED ORDER — LEVOCETIRIZINE DIHYDROCHLORIDE 5 MG PO TABS: 5.0000 mg | ORAL_TABLET | Freq: Every evening | ORAL | Status: DC

## 2016-03-15 MED ORDER — SALINE 0.9 % NA AERS: 1.0000 | INHALATION_SPRAY | Freq: Every day | NASAL | Status: DC

## 2016-03-15 NOTE — Patient Instructions (Signed)
Allergies An allergy is an abnormal reaction to a substance by the body's defense system (immune system). Allergies can develop at any age. WHAT CAUSES ALLERGIES? An allergic reaction happens when the immune system mistakenly reacts to a normally harmless substance, called an allergen, as if it were harmful. The immune system releases antibodies to fight the substance. Antibodies eventually release a chemical called histamine into the bloodstream. The release of histamine is meant to protect the body from infection, but it also causes discomfort. An allergic reaction can be triggered by:  Eating an allergen.  Inhaling an allergen.  Touching an allergen. WHAT TYPES OF ALLERGIES ARE THERE? There are many types of allergies. Common types include:  Seasonal allergies. People with this type of allergy are usually allergic to substances that are only present during certain seasons, such as molds and pollens.  Food allergies.  Drug allergies.  Insect allergies.  Animal dander allergies. WHAT ARE SYMPTOMS OF ALLERGIES? Possible allergy symptoms include:  Swelling of the lips, face, tongue, mouth, or throat.  Sneezing, coughing, or wheezing.  Nasal congestion.  Tingling in the mouth.  Rash.  Itching.  Itchy, red, swollen areas of skin (hives).  Watery eyes.  Vomiting.  Diarrhea.  Dizziness.  Lightheadedness.  Fainting.  Trouble breathing or swallowing.  Chest tightness.  Rapid heartbeat. HOW ARE ALLERGIES DIAGNOSED? Allergies are diagnosed with a medical and family history and one or more of the following:  Skin tests.  Blood tests.  A food diary. A food diary is a record of all the foods and drinks you have in a day and of all the symptoms you experience.  The results of an elimination diet. An elimination diet involves eliminating foods from your diet and then adding them back in one by one to find out if a certain food causes an allergic reaction. HOW ARE  ALLERGIES TREATED? There is no cure for allergies, but allergic reactions can be treated with medicine. Severe reactions usually need to be treated at a hospital. HOW CAN REACTIONS BE PREVENTED? The best way to prevent an allergic reaction is by avoiding the substance you are allergic to. Allergy shots and medicines can also help prevent reactions in some cases. People with severe allergic reactions may be able to prevent a life-threatening reaction called anaphylaxis with a medicine given right after exposure to the allergen.   This information is not intended to replace advice given to you by your health care provider. Make sure you discuss any questions you have with your health care provider.   Document Released: 01/31/2003 Document Revised: 11/28/2014 Document Reviewed: 08/19/2014 Elsevier Interactive Patient Education 2016 Else[employer] Inc[employer] Cough, Adult Coughing is a reflex that clears your throat and your airways. Coughing helps to heal and protect your lungs. It is normal to cough occasionally, but a cough that happens with other symptoms or lasts a long time may be a sign of a condition that needs treatment. A cough may last only 2-3 weeks (acute), or it may last longer than 8 weeks (chronic). CAUSES Coughing is commonly caused by:  Breathing in substances that irritate your lungs.  A viral or bacterial respiratory infection.  Allergies.  Asthma.  Postnasal drip.  Smoking.  Acid backing up from the stomach into the esophagus (gastroesophageal reflux).  Certain medicines.  Chronic lung problems, including COPD (or rarely, lung cancer).  Other medical conditions such as heart failure. HOME CARE INSTRUCTIONS  Pay attention to any changes in your symptoms. Take these actions to help  with your discomfort:  Take medicines only as told by your health care provider.  If you were prescribed an antibiotic medicine, take it as told by your health care provider. Do not stop taking the  antibiotic even if you start to feel better.  Talk with your health care provider before you take a cough suppressant medicine.  Drink enough fluid to keep your urine clear or pale yellow.  If the air is dry, use a cold steam vaporizer or humidifier in your bedroom or your home to help loosen secretions.  Avoid anything that causes you to cough at work or at home.  If your cough is worse at night, try sleeping in a semi-upright position.  Avoid cigarette smoke. If you smoke, quit smoking. If you need help quitting, ask your health care provider.  Avoid caffeine.  Avoid alcohol.  Rest as needed. SEEK MEDICAL CARE IF:   You have new symptoms.  You cough up pus.  Your cough does not get better after 2-3 weeks, or your cough gets worse.  You cannot control your cough with suppressant medicines and you are losing sleep.  You develop pain that is getting worse or pain that is not controlled with pain medicines.  You have a fever.  You have unexplained weight loss.  You have night sweats. SEEK IMMEDIATE MEDICAL CARE IF:  You cough up blood.  You have difficulty breathing.  Your heartbeat is very fast.   This information is not intended to replace advice given to you by your health care provider. Make sure you discuss any questions you have with your health care provider.   Document Released: 05/06/2011 Document Revised: 07/29/2015 Document Reviewed: 01/14/2015 Elsevier Interactive Patient Education [employer].

## 2016-03-15 NOTE — Progress Notes (Signed)
Subjective:    Patient ID: Lorraine Blair, female    DOB: Nov 06, 1951, 65 y.o.   MRN: [id number]  HPI  Lorraine Blair, a 65 year old female that presents complaining of persistent cough. Patient has had cough for greater than 1 year. She continues to complain of post nasal drip and productive cough. She says that cough improved minimally on Omeprazole.   She denies belching, chest pain, cough, deep pressure at base of neck, difficulty swallowing, early satiety, heartburn, laryngitis, midespigastric pain, shortness of breath, unexpected weight loss and upper abdominal discomfort. Symptoms have been present for greater than 1 year. She denies dysphagia.  She has not lost weight. She denies melena, hematochezia, hematemesis, and coffee ground emesis. Medical therapy in the past has included anti-histamines.  Past Medical History  Diagnosis Date  . Allergic rhinitis due to pollen   . Pure hypercholesterolemia   . Neuromuscular disorder ([place])   . Carpal tunnel syndrome    Past Surgical History  Procedure Laterality Date  . Tubal ligation  1989   Social History   Social History  . Marital Status: Single    Spouse Name: N/A  . Number of Children: N/A  . Years of Education: N/A   Occupational History  . Not on file.   Social History Main Topics  . Smoking status: Former Smoker    Types: Cigarettes    Quit date: 06/24/1988  . Smokeless tobacco: Never Used  . Alcohol Use: 0.6 oz/week    1 Glasses of wine per week  . Drug Use: No  . Sexual Activity: No   Other Topics Concern  . Not on file   Social History Narrative   Review of Systems  Constitutional: Negative.   HENT: Positive for postnasal drip.   Eyes: Negative.   Respiratory: Positive for cough.   Cardiovascular: Negative.   Gastrointestinal: Negative.   Endocrine: Negative.   Genitourinary: Negative.   Musculoskeletal: Negative.   Skin: Negative.   Allergic/Immunologic: Negative.   Neurological: Negative.    Hematological: Negative.   Psychiatric/Behavioral: Negative.         Objective:   Physical Exam  Constitutional: She is oriented to person, place, and time. She appears well-developed and well-nourished.  HENT:  Head: Normocephalic and atraumatic.  Right Ear: External ear normal.  Left Ear: External ear normal.  Nose: Mucosal edema present. Right sinus exhibits no maxillary sinus tenderness and no frontal sinus tenderness. Left sinus exhibits no maxillary sinus tenderness and no frontal sinus tenderness.  Mouth/Throat: Mucous membranes are pale. Posterior oropharyngeal erythema present.  Eyes: Conjunctivae and EOM are normal. Pupils are equal, round, and reactive to light.  Neck: Normal range of motion. Neck supple.  Cardiovascular: Normal rate, regular rhythm, normal heart sounds and intact distal pulses.   Pulmonary/Chest: Effort normal and breath sounds normal.  Abdominal: Soft. Bowel sounds are normal.  Musculoskeletal: Normal range of motion.  Neurological: She is alert and oriented to person, place, and time. She has normal reflexes.  Skin: Skin is warm and dry.  Psychiatric: She has a normal mood and affect. Her behavior is normal. Judgment and thought content normal.      BP 128/80 mmHg  Pulse 68  Temp(Src) 97.7 F (36.5 C) (Oral)  Resp 14  Ht 5\' 6"  (1.676 m)  Wt 164 lb (74.39 kg)  BMI 26.48 kg/m2  SpO2 99% Assessment & Plan:   1. Allergic cough Explained to Ms. Hanawalt that an allergic cough is one of  the most common causes of a chronic cough. Will start a trial of antihistamine. Also, recommend increasing water intake and humidifying air while sleeping. Patient was also given samples of [employer] nasal saline.   - levocetirizine (XYZAL) 5 MG tablet; Take 1 tablet (5 mg total) by mouth every evening.  Dispense: 30 tablet; Refill: 2  2. Post-nasal drip - Saline (SIMPLY SALINE) 0.9 % AERS; Place 1 spray into the nose daily.  Dispense: 1 Can; Refill: 0   RTC:  As needed  Dorena Dew, FNP

## 2016-04-25 ENCOUNTER — Other Ambulatory Visit (INDEPENDENT_AMBULATORY_CARE_PROVIDER_SITE_OTHER): Payer: Medicare Other

## 2016-04-25 DIAGNOSIS — R7303 Prediabetes: Secondary | ICD-10-CM | POA: Diagnosis not present

## 2016-04-25 LAB — LIPID PANEL
Cholesterol: 172 mg/dL (ref 125–200)
HDL: 57 mg/dL (ref >=46)
LDL CALC: 97 mg/dL (ref <130)
Total CHOL/HDL Ratio: 3 Ratio (ref <=5.0)
Triglycerides: 89 mg/dL (ref <150)
VLDL: 18 mg/dL (ref <30)

## 2016-04-25 LAB — HEMOGLOBIN A1C
HEMOGLOBIN A1C: 6.1 % — AB (ref <5.7)
MEAN PLASMA GLUCOSE: 128 mg/dL

## 2016-05-02 ENCOUNTER — Encounter: Payer: Self-pay | Admitting: Family Medicine

## 2016-05-02 ENCOUNTER — Ambulatory Visit (INDEPENDENT_AMBULATORY_CARE_PROVIDER_SITE_OTHER): Payer: Medicare Other | Admitting: Family Medicine

## 2016-05-02 VITALS — BP 117/71 | HR 78 | Temp 98.6°F | Resp 16 | Ht 66.0 in | Wt 163.0 lb

## 2016-05-02 DIAGNOSIS — Z8639 Personal history of other endocrine, nutritional and metabolic disease: Secondary | ICD-10-CM

## 2016-05-02 DIAGNOSIS — R05 Cough: Secondary | ICD-10-CM

## 2016-05-02 DIAGNOSIS — R7303 Prediabetes: Secondary | ICD-10-CM

## 2016-05-02 DIAGNOSIS — R058 Other specified cough: Secondary | ICD-10-CM

## 2016-05-02 NOTE — Progress Notes (Signed)
Subjective:    Patient ID: Lorraine Blair, female    DOB: [birth year], 65 y.o.   MRN: [id number]  HPI   Lorraine Blair, a 65 year old female that presents complaining of persistent cough. Patient has had cough for greater than 1 year. She continues to complain of post nasal drip and productive cough. She says that cough improved minimally on Omeprazole.   She denies belching, chest pain, cough, deep pressure at base of neck, difficulty swallowing, early satiety, heartburn, laryngitis, midespigastric pain, shortness of breath, unexpected weight loss and upper abdominal discomfort. Symptoms have been present for greater than 1 year. She denies dysphagia.  She has not lost weight. She denies melena, hematochezia, hematemesis, and coffee ground emesis. Medical therapy in the past has included anti-histamines.  Past Medical History  Diagnosis Date  . Allergic rhinitis due to pollen   . Pure hypercholesterolemia   . Neuromuscular disorder ([place])   . Carpal tunnel syndrome    Past Surgical History  Procedure Laterality Date  . Tubal ligation  1989   Social History   Social History  . Marital Status: Single    Spouse Name: N/A  . Number of Children: N/A  . Years of Education: N/A   Occupational History  . Not on file.   Social History Main Topics  . Smoking status: Former Smoker    Types: Cigarettes    Quit date: 06/24/1988  . Smokeless tobacco: Never Used  . Alcohol Use: 0.6 oz/week    1 Glasses of wine per week  . Drug Use: No  . Sexual Activity: No   Other Topics Concern  . Not on file   Social History Narrative   Immunization History  Administered Date(s) Administered  . Influenza,inj,Quad PF,36+ Mos 10/22/2014, 10/27/2015  . Pneumococcal Polysaccharide-23 07/17/2014  . Tdap 07/17/2014  . Zoster 10/22/2014   Review of Systems  Constitutional: Negative.   HENT: Positive for postnasal drip.   Eyes: Negative.   Respiratory: Positive for cough.    Cardiovascular: Negative.   Gastrointestinal: Negative.   Endocrine: Negative.   Genitourinary: Negative.   Musculoskeletal: Negative.   Skin: Negative.   Allergic/Immunologic: Negative.   Neurological: Negative.   Hematological: Negative.   Psychiatric/Behavioral: Negative.         Objective:   Physical Exam  Constitutional: She is oriented to person, place, and time. She appears well-developed and well-nourished.  HENT:  Head: Normocephalic and atraumatic.  Right Ear: External ear normal.  Left Ear: External ear normal.  Nose: Mucosal edema present. Right sinus exhibits no maxillary sinus tenderness and no frontal sinus tenderness. Left sinus exhibits no maxillary sinus tenderness and no frontal sinus tenderness.  Mouth/Throat: Mucous membranes are pale. Posterior oropharyngeal erythema present.  Eyes: Conjunctivae and EOM are normal. Pupils are equal, round, and reactive to light.  Neck: Normal range of motion. Neck supple.  Cardiovascular: Normal rate, regular rhythm, normal heart sounds and intact distal pulses.   Pulmonary/Chest: Effort normal and breath sounds normal.  Abdominal: Soft. Bowel sounds are normal.  Musculoskeletal: Normal range of motion.  Neurological: She is alert and oriented to person, place, and time. She has normal reflexes.  Skin: Skin is warm and dry.  Psychiatric: She has a normal mood and affect. Her behavior is normal. Judgment and thought content normal.      BP 117/71 mmHg  Pulse 78  Temp(Src) 98.6 F (37 C) (Oral)  Resp 16  Ht 5\' 6"  (1.676 m)  Wt 163  lb (73.936 kg)  BMI 26.32 kg/m2  SpO2 98% Assessment & Plan:  1. Allergic cough Patient has been trialed on several medications for allergic cough.  She has also been treated for GERD Will send referral to ENT for further evaluation and treatment  - Ambulatory referral to ENT  2. Prediabetes Reviewed previous hemoglobin a1C, 6.1, which is consistent with pre-diabetes. Recommend a  lowfat, low carbohydrate diet divided over 5-6 small meals, increase water intake to 6-8 glasses, and 150 minutes per week of cardiovascular exercise.   3. History of hyperlipidemia, Reviewed laboratory results. Total cholesterol is 172 on diet and exercise regimen.    RTC: Patient to follow up in 6 months for prediabetes and hypercholesterolemia   Routine Health Maintenance:  Patient is up to date on immunizations, breast cancer screening, and colonoscopy  Dorena Dew, FNP

## 2016-05-18 ENCOUNTER — Encounter: Payer: Self-pay | Admitting: Internal Medicine

## 2016-05-31 DIAGNOSIS — R059 Cough, unspecified: Secondary | ICD-10-CM | POA: Insufficient documentation

## 2016-10-24 ENCOUNTER — Encounter: Payer: Self-pay | Admitting: Family Medicine

## 2016-10-24 ENCOUNTER — Ambulatory Visit (INDEPENDENT_AMBULATORY_CARE_PROVIDER_SITE_OTHER): Payer: Medicare Other | Admitting: Family Medicine

## 2016-10-24 VITALS — BP 144/84 | HR 79 | Temp 98.0°F | Resp 12 | Ht 66.0 in | Wt 158.0 lb

## 2016-10-24 DIAGNOSIS — R05 Cough: Secondary | ICD-10-CM | POA: Diagnosis not present

## 2016-10-24 DIAGNOSIS — R059 Cough, unspecified: Secondary | ICD-10-CM

## 2016-10-24 DIAGNOSIS — M2550 Pain in unspecified joint: Secondary | ICD-10-CM | POA: Diagnosis not present

## 2016-10-24 NOTE — Progress Notes (Signed)
Lorraine Blair, is a 65 y.o. female  [id number]  [id number]  DOB - 30-Sep-1951  CC:  Chief Complaint  Patient presents with  . joint swelling    42month ago noticed tenderness in finger joint, and noticed burning sensation up her arm, and then developed pain in other fingers, hands are stiff in the morning, 2 weeks ago noticed ankle joint was swollen and tender, and now its in her knees       HPI: Lorraine Blair is a 65 y.o. female here for a "sick visit" for joint pain. She has an upcoming routine follow-up and health maintenance visit, will be only address her current complaint today. She reports Pain and swelling for about a month.   No Known Allergies Past Medical History:  Diagnosis Date  . Allergic rhinitis due to pollen   . Carpal tunnel syndrome   . Neuromuscular disorder ([place])   . Pure hypercholesterolemia    Current Outpatient Prescriptions on File Prior to Visit  Medication Sig Dispense Refill  . Multiple Vitamin (MULTIVITAMIN) tablet Take 1 tablet by mouth daily. Reported on 02/09/2016    . levocetirizine (XYZAL) 5 MG tablet Take 1 tablet (5 mg total) by mouth every evening. (Patient not taking: Reported on 10/24/2016) 30 tablet 2  . Saline (SIMPLY SALINE) 0.9 % AERS Place 1 spray into the nose daily. (Patient not taking: Reported on 10/24/2016) 1 Can 0   No current facility-administered medications on file prior to visit.    Family History  Problem Relation Age of Onset  . Colon cancer Neg Hx    Social History   Social History  . Marital status: Single    Spouse name: N/A  . Number of children: N/A  . Years of education: N/A   Occupational History  . Not on file.   Social History Main Topics  . Smoking status: Former Smoker    Types: Cigarettes    Quit date: 06/24/1988  . Smokeless tobacco: Never Used  . Alcohol use 0.6 oz/week    1 Glasses of wine per week  . Drug use: No  . Sexual activity: No   Other Topics Concern  . Not on file    Social History Narrative  . No narrative on file    Review of Systems: Constitutional: Negative Skin: Negative HENT: Negative  Eyes: Negative  Neck: Negative Respiratory: Negative Cardiovascular: Negative Gastrointestinal: Negative Genitourinary: Negative  Musculoskeletal: joint pain  Neurological: Negative for Hematological: Negative  Psychiatric/Behavioral: Negative    Objective:   Vitals:   10/24/16 1428  BP: (!) 144/84  Pulse: 79  Resp: 12  Temp: 98 F (36.7 C)    Physical Exam: Constitutional: Patient appears well-developed and well-nourished. No distress. HENT: Normocephalic, atraumatic, External right and left ear normal. Oropharynx is clear and moist.  Eyes: Conjunctivae and EOM are normal. PERRLA, no scleral icterus. Neck: Normal ROM. Neck supple. No lymphadenopathy, No thyromegaly. CVS: RRR, S1/S2 +, no murmurs, no gallops, no rubs Pulmonary: Effort and breath sounds normal, no stridor, rhonchi, wheezes, rales.  Abdominal: Soft. Normoactive BS,, no distension, tenderness, rebound or guarding.  Musculoskeletal: Normal range of motion. Mild swelling and tenderness of mentioned finger joings Neuro: Alert.Normal muscle tone coordination. Non-focal Skin: Skin is warm and dry. No rash noted. Not diaphoretic. No erythema. No pallor. Psychiatric: Normal mood and affect. Behavior, judgment, thought content normal.  Lab Results  Component Value Date   WBC 4.5 10/27/2015   HGB 12.7 10/27/2015   HCT 37.7 10/27/2015  MCV 88.7 10/27/2015   PLT 252 10/27/2015   Lab Results  Component Value Date   CREATININE 0.85 10/27/2015   BUN 17 10/27/2015   NA 138 10/27/2015   K 4.1 10/27/2015   CL 100 10/27/2015   CO2 29 10/27/2015    Lab Results  Component Value Date   HGBA1C 6.1 (H) 04/25/2016   Lipid Panel     Component Value Date/Time   CHOL 172 04/25/2016 0832   TRIG 89 04/25/2016 0832   HDL 57 04/25/2016 0832   CHOLHDL 3.0 04/25/2016 0832   VLDL 18  04/25/2016 0832   LDLCALC 97 04/25/2016 0832        Assessment and plan:   1. Arthralgia, unspecified joint  - ANA, IFA Comprehensive Panel - Rheumatoid factor - Sedimentation Rate - C-reactive protein - Uric Acid  2. Cough  - Ambulatory referral to Pulmonology   Has appointment for routine follow-up and health maintenance in January with [name]  The patient was given clear instructions to go to ER or return to medical center if symptoms don't improve, worsen or new problems develop. The patient verbalized understanding.    Micheline Chapman FNP  10/24/2016, 4:21 PM

## 2016-10-25 LAB — ANA, IFA COMPREHENSIVE PANEL
ANA: POSITIVE — AB
ENA SM Ab Ser-aCnc: <1
SM/RNP: NEGATIVE
SSA (Ro) (ENA) Antibody, IgG: >8 — ABNORMAL HIGH
SSB (La) (ENA) Antibody, IgG: >8 — ABNORMAL HIGH
Scleroderma (Scl-70) (ENA) Antibody, IgG: <1
ds DNA Ab: 2 IU/mL

## 2016-10-25 LAB — C-REACTIVE PROTEIN: CRP: 4.1 mg/L (ref <8.0)

## 2016-10-25 LAB — URIC ACID: URIC ACID, SERUM: 5.6 mg/dL (ref 2.5–7.0)

## 2016-10-25 LAB — ANTI-NUCLEAR AB-TITER (ANA TITER): ANA Titer 1: 1:160 {titer} — ABNORMAL HIGH

## 2016-10-25 LAB — SEDIMENTATION RATE

## 2016-10-25 LAB — RHEUMATOID FACTOR: RHEUMATOID FACTOR: 254 [IU]/mL — AB (ref <14)

## 2016-10-25 NOTE — Patient Instructions (Signed)
Will call when results are in.

## 2016-10-26 ENCOUNTER — Telehealth: Payer: Self-pay

## 2016-10-26 NOTE — Telephone Encounter (Signed)
Received a call from [facility] lab to advised they did not have enough blood to complete the Sed Rate test ordered on patient.

## 2016-10-27 ENCOUNTER — Ambulatory Visit: Payer: BC Managed Care – PPO | Admitting: Family Medicine

## 2016-11-01 ENCOUNTER — Other Ambulatory Visit: Payer: Self-pay | Admitting: Family Medicine

## 2016-11-01 DIAGNOSIS — M25542 Pain in joints of left hand: Principal | ICD-10-CM

## 2016-11-01 DIAGNOSIS — M25541 Pain in joints of right hand: Secondary | ICD-10-CM

## 2016-11-02 ENCOUNTER — Telehealth: Payer: Self-pay

## 2016-11-02 NOTE — Telephone Encounter (Signed)
-----   Message from Micheline Chapman, NP sent at 11/01/2016  2:37 PM EST ----- Labs are suggestive of something like Rheumatoid arthritis. Need to send you to rheumatologist

## 2016-11-02 NOTE — Telephone Encounter (Signed)
Called and spoke with patient, advised of labs indicating rheumatoid arthritis. Patient verbalized understanding that we are sending to rheumatology for further review. Thanks!

## 2016-11-03 ENCOUNTER — Ambulatory Visit (INDEPENDENT_AMBULATORY_CARE_PROVIDER_SITE_OTHER)
Admission: RE | Admit: 2016-11-03 | Discharge: 2016-11-03 | Disposition: A | Discharge status: Home / Self Care | Payer: Medicare Other | Source: Ambulatory Visit | Attending: Internal Medicine | Admitting: Internal Medicine

## 2016-11-03 ENCOUNTER — Other Ambulatory Visit (INDEPENDENT_AMBULATORY_CARE_PROVIDER_SITE_OTHER): Payer: Medicare Other

## 2016-11-03 ENCOUNTER — Ambulatory Visit (INDEPENDENT_AMBULATORY_CARE_PROVIDER_SITE_OTHER): Payer: Medicare Other | Admitting: Internal Medicine

## 2016-11-03 ENCOUNTER — Encounter: Payer: Self-pay | Admitting: Internal Medicine

## 2016-11-03 VITALS — BP 132/82 | HR 95 | Ht 66.0 in | Wt 162.2 lb

## 2016-11-03 DIAGNOSIS — J45991 Cough variant asthma: Secondary | ICD-10-CM

## 2016-11-03 LAB — NITRIC OXIDE: NITRIC OXIDE: 21

## 2016-11-03 MED ORDER — FAMOTIDINE 20 MG PO TABS: ORAL_TABLET | ORAL | 2 refills | Status: DC

## 2016-11-03 MED ORDER — TRAMADOL HCL 50 MG PO TABS: ORAL_TABLET | ORAL | 0 refills | Status: DC

## 2016-11-03 MED ORDER — PREDNISONE 10 MG PO TABS: ORAL_TABLET | ORAL | 0 refills | Status: DC

## 2016-11-03 MED ORDER — PANTOPRAZOLE SODIUM 40 MG PO TBEC: 40.0000 mg | DELAYED_RELEASE_TABLET | Freq: Every day | ORAL | 2 refills | Status: DC

## 2016-11-03 NOTE — Patient Instructions (Addendum)
The key to effective treatment for your cough is eliminating the non-stop cycle of cough you're stuck in long enough to let your airway heal completely and then see if there is anything still making you cough once you stop the cough suppression, but this should take no more than 5 days to figure out  First take delsym two tsp every 12 hours and supplement if needed with  tramadol 50 mg up to 2 every 4 hours to suppress the urge to cough at all or even clear your throat. Swallowing water or using ice chips/non mint and menthol containing candies (such as lifesavers or sugarless jolly ranchers) are also effective.  You should rest your voice and avoid activities that you know make you cough.  Once you have eliminated the cough for 3 straight days try reducing the tramadol first,  then the delsym as tolerated.    Prednisone 10 mg take  4 each am x 2 days,   2 each am x 2 days,  1 each am x 2 days and stop (this is to eliminate allergies and inflammation from coughing)  Protonix (pantoprazole) Take 30-60 min before first meal of the day and Pepcid 20 mg one bedtime plus chlorpheniramine 4 mg x 2 at bedtime (both available over the counter)  until cough is completely gone for at least a week without the need for cough suppression  GERD (REFLUX)  is an extremely common cause of respiratory symptoms, many times with no significant heartburn at all.    It can be treated with medication, but also with lifestyle changes including avoidance of late meals, excessive alcohol, smoking cessation, and avoid fatty foods, chocolate, peppermint, colas, red wine, and acidic juices such as orange juice.  NO MINT OR MENTHOL PRODUCTS SO NO COUGH DROPS   USE HARD CANDY INSTEAD (jolley ranchers or Stover's or Lifesavers (all available in sugarless versions) NO OIL BASED VITAMINS - use powdered substitutes.  Please remember to go to the lab and x-ray department downstairs for your tests - we will call you with the results  when they are available.     If not all better return after [facility] for further evaluation

## 2016-11-03 NOTE — Progress Notes (Signed)
Subjective:    Patient ID: Lorraine Blair, female    DOB: 1951/04/10, 65 y.o.   MRN: [id number]  HPI   46 yobf  Quit smoking in 1989  With intermitting sinus infections maybe twice a year and gradually improving  until the day after arrived back from [place] Aug 4696  severe productive cough / assoc with chest congestion and post drainage x 2 weeks > lots of white mucus seemed to improve except for harsh barking quality and present ever since so referred to pulmonary clinic 11/03/2016 by  Carlton Adam NP   11/03/2016 1st [facility] office visit/ Chidiebere Wynn   Chief Complaint  Patient presents with  . Pulmonary Consult    Dr. Balinda Quails referred pt, cough since August 2016, ENT said she did not have GERD, tramadol helped with the coughing, cough on and off today, early mornings coughs up clear/yellow mucus  pt unclear on date of onset but records indicate  jan 2017 sought care for cough of duration x  6 months and rx with zyrtec and and something for reflux but no better / cough worse when wakes up each am but sleeps fine p taking tramadol and really no excess or overtly purulent am sputum and cough tends to worsen as day goes on unless takes tramadol with sensation of pnds but no rhinorhea.  Has h/o severe sinus infections ever since adult so went to [facility]  ENT July 2017 > ? Might be reflux  rx tramadol up to twice daily seems to help    No obvious day to day or daytime variability or assoc sob or   mucus plugs or hemoptysis or cp or chest tightness, subjective wheeze or overt sinus or hb symptoms. No unusual exp hx or h/o childhood pna/ asthma or knowledge of premature birth.  Sleeping ok without nocturnal  or early am exacerbation  of respiratory  c/o's or need for noct saba. Also denies any obvious fluctuation of symptoms with weather or environmental changes or other aggravating or alleviating factors except as outlined above   Current Medications, Allergies, Complete  Past Medical History, Past Surgical History, Family History, and Social History were reviewed in [employer] record.  ROS  The following are not active complaints unless bolded sore throat, dysphagia, dental problems, itching, sneezing,  nasal congestion or excess/ purulent secretions, ear ache,   fever, chills, sweats, unintended wt loss, classically pleuritic or exertional cp,  orthopnea pnd or leg swelling, presyncope, palpitations, abdominal pain, anorexia, nausea, vomiting, diarrhea  or change in bowel or bladder habits, change in stools or urine, dysuria,hematuria,  rash, arthralgias, visual complaints, headache, numbness, weakness or ataxia or problems with walking or coordination,  change in mood/affect or memory.         Review of Systems  HENT: Positive for nosebleeds and postnasal drip.   Respiratory: Positive for cough.   Musculoskeletal: Positive for arthralgias.       Objective:   Physical Exam  amb bf nad until dry harsh barking cough / vigorous throat clearing  . Wt Readings from Last 3 Encounters:  11/03/16 162 lb 3.2 oz (73.6 kg)  10/24/16 158 lb (71.7 kg)  05/02/16 163 lb (73.9 kg)    Vital signs reviewed    - Note on arrival 02 sats  95% on RA     HEENT: nl dentition,  and oropharynx which is pristine.. Nl external ear canals without cough reflex - moderate bilateral non-specific turbinate edema  NECK :  without JVD/Nodes/TM/ nl carotid upstrokes bilaterally   LUNGS: no acc muscle use,  Nl contour chest which is clear to A and P bilaterally with cough on insp   CV:  RRR  no s3 or murmur or increase in P2, nad no edema   ABD:  soft and nontender with nl inspiratory excursion in the supine position. No bruits or organomegaly appreciated, bowel sounds nl  MS:  Nl gait/ ext warm without deformities, calf tenderness, cyanosis or clubbing No obvious joint restrictions   SKIN: warm and dry without lesions    NEURO:  alert, approp, nl  sensorium with  no motor or cerebellar deficits apparent.    CXR PA and Lateral:   11/03/2016 :    I personally reviewed images and agree with radiology impression as follows:   ? Mild ILD   Labs ordered 11/03/2016  Allergy profile / esr   [facility] 08/10/12 Pos HH s gerd     Assessment & Plan:

## 2016-11-04 ENCOUNTER — Telehealth: Payer: Self-pay | Admitting: Internal Medicine

## 2016-11-04 LAB — RESPIRATORY ALLERGY PROFILE REGION II ~~LOC~~
Allergen, A. alternata, m6: <0.1 kU/L
Allergen, C. Herbarum, M2: <0.1 kU/L
Allergen, Comm Silver Birch, t9: <0.1 kU/L
Allergen, D pternoyssinus,d7: <0.1 kU/L
Allergen, Mulberry, t76: <0.1 kU/L
Allergen, P. notatum, m1: <0.1 kU/L
Aspergillus fumigatus, m3: <0.1 kU/L
Bermuda Grass: <0.1 kU/L
Box Elder IgE: <0.1 kU/L
Cockroach: <0.1 kU/L
Common Ragweed: <0.1 kU/L
D. farinae: <0.1 kU/L
Dog Dander: <0.1 kU/L
Elm IgE: <0.1 kU/L
IgE (Immunoglobulin E), Serum: <2 kU/L (ref <115)
Johnson Grass: <0.1 kU/L
Rough Pigweed  IgE: <0.1 kU/L
Sheep Sorrel IgE: <0.1 kU/L

## 2016-11-04 LAB — CBC WITH DIFFERENTIAL/PLATELET
Basophils Absolute: 0 10*3/uL (ref 0.0–0.1)
Basophils Relative: 0.7 % (ref 0.0–3.0)
EOS PCT: 1.8 % (ref 0.0–5.0)
Eosinophils Absolute: 0.1 10*3/uL (ref 0.0–0.7)
HCT: 37 % (ref 36.0–46.0)
Hemoglobin: 12.3 g/dL (ref 12.0–15.0)
LYMPHS ABS: 1.9 10*3/uL (ref 0.7–4.0)
Lymphocytes Relative: 30 % (ref 12.0–46.0)
MCHC: 33.2 g/dL (ref 30.0–36.0)
MCV: 88.1 fl (ref 78.0–100.0)
MONO ABS: 0.4 10*3/uL (ref 0.1–1.0)
Monocytes Relative: 6.2 % (ref 3.0–12.0)
NEUTROS PCT: 61.3 % (ref 43.0–77.0)
Neutro Abs: 3.9 10*3/uL (ref 1.4–7.7)
PLATELETS: 278 10*3/uL (ref 150.0–400.0)
RBC: 4.2 Mil/uL (ref 3.87–5.11)
RDW: 15.6 % — AB (ref 11.5–15.5)
WBC: 6.4 10*3/uL (ref 4.0–10.5)

## 2016-11-04 LAB — SEDIMENTATION RATE: SED RATE: 104 mm/h — AB (ref 0–30)

## 2016-11-04 NOTE — Assessment & Plan Note (Addendum)
FENO 11/03/2016  =   21 Allergy profile 11/03/2016 >  Eos 0. /  IgE   - cyclical cough regimen with 6 days of pred 11/03/2016 >>>   The shrill nature of the cough suggests a component of UACS over asthma but the cough on insp is more typical of possible underlying ILD (see separate a/p)   Of the three most common causes of chronic cough, only one (GERD/ which was absent in 07/2012 at [facility] but showed HH so at risk )  can actually cause the other two (asthma and post nasal drip syndrome)  and perpetuate the cylce of cough inducing airway trauma, inflammation, heightened sensitivity to reflux which is prompted by the cough itself via a cyclical mechanism.    This may partially respond to steroids and look like asthma and post nasal drainage but never erradicated completely unless the cough and the secondary reflux are eliminated, preferably both at the same time.  While not intuitively obvious, many patients with chronic low grade reflux do not cough until there is a secondary insult that disturbs the protective epithelial barrier and exposes sensitive nerve endings.  This can be viral or direct physical injury such as with an endotracheal tube.   The point is that once this occurs, it is difficult to eliminate using anything but a maximally effective acid suppression regimen at least in the short run, accompanied by an appropriate diet to address non acid GERD.   For now rec max rx for gerd/ cyclical cough then regroup   Total time devoted to counseling  > 50 % of a 60 min office visit:  review case with pt/ discussion of options/alternatives/ personally creating written customized instructions  in presence of pt  then going over those specific  Instructions directly with the pt including how to use all of the meds but in particular covering each new medication in detail and the difference between the maintenance/automatic meds and the prns using an action plan format for the latter.  Please see AVS from  this visit for a full list of these instructions

## 2016-11-04 NOTE — Telephone Encounter (Signed)
Tanda Rockers, MD sent to Rosana Berger, CMA        Call pt: Reviewed cxr and suggests scarring ? From previous infection but nothing to do different Be sure patient has f/u ov so we can go over all the details of this study and get a plan together moving forward - ok to move up f/u if not feeling better and wants to be seen sooner     Spoke with pt and notified of results per Dr. Melvyn Novas. Pt verbalized understanding and denied any questions.

## 2016-11-04 NOTE — Progress Notes (Signed)
Spoke with pt and notified of results per Dr. Wert. Pt verbalized understanding and denied any questions. 

## 2016-11-04 NOTE — Progress Notes (Signed)
LMTCB

## 2016-11-06 ENCOUNTER — Encounter: Payer: Self-pay | Admitting: Internal Medicine

## 2016-11-23 ENCOUNTER — Ambulatory Visit (INDEPENDENT_AMBULATORY_CARE_PROVIDER_SITE_OTHER): Payer: Medicare Other | Admitting: Family Medicine

## 2016-11-23 ENCOUNTER — Encounter: Payer: Self-pay | Admitting: Family Medicine

## 2016-11-23 VITALS — BP 139/83 | HR 88 | Temp 98.2°F | Resp 16 | Ht 66.0 in | Wt 159.0 lb

## 2016-11-23 DIAGNOSIS — Z0000 Encounter for general adult medical examination without abnormal findings: Secondary | ICD-10-CM

## 2016-11-23 DIAGNOSIS — Z23 Encounter for immunization: Secondary | ICD-10-CM | POA: Diagnosis not present

## 2016-11-23 LAB — POCT URINALYSIS DIP (DEVICE)
Bilirubin Urine: NEGATIVE
Glucose, UA: NEGATIVE mg/dL
HGB URINE DIPSTICK: NEGATIVE
Ketones, ur: NEGATIVE mg/dL
Leukocytes, UA: NEGATIVE
Nitrite: NEGATIVE
PH: 5.5 (ref 5.0–8.0)
PROTEIN: NEGATIVE mg/dL
SPECIFIC GRAVITY, URINE: 1.025 (ref 1.005–1.030)
UROBILINOGEN UA: 0.2 mg/dL (ref 0.0–1.0)

## 2016-11-23 LAB — POCT GLYCOSYLATED HEMOGLOBIN (HGB A1C): Hemoglobin A1C: 5.7

## 2016-11-23 NOTE — Patient Instructions (Addendum)
Healthcare power of attorney/living will

## 2016-11-23 NOTE — Progress Notes (Signed)
ANNUAL PREVENTATIVE VISIT AND CPE  Subjective:  Lorraine Blair is a 66 y.o. female who presents for [facility] Visit.     She does not have a history of hypertension. Today her BP is BP: 139/83 She does workout. She denies chest pain, shortness of breath, dizziness.  She is not on cholesterol medication and denies myalgias. Her cholesterol is at goal. The cholesterol last visit was:   Lab Results  Component Value Date   CHOL 172 04/25/2016   HDL 57 04/25/2016   LDLCALC 97 04/25/2016   TRIG 89 04/25/2016   CHOLHDL 3.0 04/25/2016   She does have prediabetes. She has  been working on diet and exercise for prediabetes, and denies foot ulcerations, hyperglycemia, increased appetite, nausea, paresthesia of the feet, visual disturbances, vomiting and weight loss. Last A1C in the office was:  Lab Results  Component Value Date   HGBA1C 6.1 (H) 04/25/2016   Patient is on Vitamin D supplement.   No results found for: VD25OH     Names of Other Physician/Practitioners you currently use: 1. [facility] here for primary care 2. [facility] 3. Dentist/ December 2017  Patient Care Team: Dorena Dew, FNP as PCP - General (Family Medicine)   Medication Review: Current Outpatient Prescriptions on File Prior to Visit  Medication Sig Dispense Refill  . famotidine (PEPCID) 20 MG tablet One at bedtime 30 tablet 2  . pantoprazole (PROTONIX) 40 MG tablet Take 1 tablet (40 mg total) by mouth daily. Take 30-60 min before first meal of the day 30 tablet 2  . Multiple Vitamin (MULTIVITAMIN) tablet Take 1 tablet by mouth daily. Reported on 02/09/2016    . predniSONE (DELTASONE) 10 MG tablet Take  4 each am x 2 days,   2 each am x 2 days,  1 each am x 2 days and stop (Patient not taking: Reported on 11/23/2016) 14 tablet 0  . Saline (SIMPLY SALINE) 0.9 % AERS Place 1 spray into the nose daily. (Patient not taking: Reported on 11/23/2016) 1 Can 0  . traMADol (ULTRAM)  50 MG tablet 1-2 every 4 hours as needed for cough or pain (Patient not taking: Reported on 11/23/2016) 40 tablet 0   No current facility-administered medications on file prior to visit.     Current Problems (verified) Patient Active Problem List   Diagnosis Date Noted  . Cough variant asthma vs UACS  11/03/2016  . History of hyperlipidemia 05/02/2016  . Prediabetes 10/28/2015  . Sinusitis, acute, maxillary 10/27/2015  . Allergic cough 05/19/2015  . Post-nasal drip 05/19/2015  . Need for Tdap vaccination 07/17/2014  . Immunization due 07/17/2014  . Encounter for screening colonoscopy 07/17/2014  . Other and unspecified hyperlipidemia 06/24/2013  . Abdominal pain, other specified site 06/24/2013    Screening Tests Health Maintenance  Topic Date Due  . Hepatitis C Screening  08/25/1951  . HIV Screening  01/25/1966  . DEXA SCAN  01/26/2016  . PNA vac Low Risk Adult (1 of 2 - PCV13) 01/26/2016  . PAP SMEAR  10/22/2017  . MAMMOGRAM  05/18/2018  . COLONOSCOPY  09/23/2019  . TETANUS/TDAP  07/17/2024  . INFLUENZA VACCINE  Completed  . ZOSTAVAX  Completed    Immunization History  Administered Date(s) Administered  . Influenza,inj,Quad PF,36+ Mos 10/22/2014, 10/27/2015  . Influenza-Unspecified 07/28/2016  . Pneumococcal Polysaccharide-23 07/17/2014  . Tdap 07/17/2014  . Zoster 10/22/2014    Preventative care: Last colonoscopy: 2015 Last mammogram: 2017 Last pap smear/pelvic  exam: 1 year ago DEXA: (Needed)  This medication is not covered by your insurance if dispensed from your Physician's office. You can obtain your vaccine at an area Pharmacy.  Allergies as of 11/23/2016   No Known Allergies     Medication List       Accurate as of 11/23/16 10:10 AM. Always use your most recent med list.          famotidine 20 MG tablet Commonly known as:  PEPCID One at bedtime   multivitamin tablet Take 1 tablet by mouth daily. Reported on 02/09/2016   pantoprazole 40 MG  tablet Commonly known as:  PROTONIX Take 1 tablet (40 mg total) by mouth daily. Take 30-60 min before first meal of the day   predniSONE 10 MG tablet Commonly known as:  DELTASONE Take  4 each am x 2 days,   2 each am x 2 days,  1 each am x 2 days and stop   Saline 0.9 % Aers Commonly known as:  SIMPLY SALINE Place 1 spray into the nose daily.   traMADol 50 MG tablet Commonly known as:  ULTRAM 1-2 every 4 hours as needed for cough or pain       Past Surgical History:  Procedure Laterality Date  . TUBAL LIGATION  1989   Family History  Problem Relation Age of Onset  . Cancer Father   . Alzheimer's disease Father   . Rheum arthritis Sister   . Colon cancer Neg Hx     History reviewed: allergies, current medications, past family history, past medical history, past social history, past surgical history and problem list   Risk Factors: Osteoporosis/FallRisk: In the past year have you fallen or had a near fall?No History of fracture in the past year: No  Tobacco Social History  Substance Use Topics  . Smoking status: Former Smoker    Types: Cigarettes    Quit date: 06/24/1988  . Smokeless tobacco: Never Used  . Alcohol use No   She does not smoke.    Alcohol Current alcohol use: none  Caffeine Current caffeine use: denies use  Exercise Current exercise: walking  Nutrition/Diet Current diet: low fat/ cholesterol  Cardiac risk factors: none.  Information of  Depression Screen Depression screen [facility] 2/9 11/23/2016  Decreased Interest 0  Down, Depressed, Hopeless 0  PHQ - 2 Score 0  Activities of Daily Living In your present state of health, do you have any difficulty performing the following activities:  Driving?No Managing money?  No Feeding yourself? No Getting from bed to chair? No Climbing a flight of stairs? No Preparing food and eating?: No Bathing or showering? No Getting dressed: No  Getting to the toilet? Using the toilet:No  In the past  year have you fallen or had a near fall?:No  Are you sexually active?  No  Do you have more than one partner?  No Vision Difficulties: No Hearing Difficulties:  No Cognition  Do you feel that you have a problem with memory?No  Do you often misplace items? No  Do you feel safe at home?  Yes  Advanced directives Does patient have a [place]? No Does patient have a Living Will? Yes   Objective:     Blood pressure 139/83, pulse 88, temperature 98.2 F (36.8 C), temperature source Oral, resp. rate 16, height 5\' 6"  (1.676 m), weight 159 lb (72.1 kg), SpO2 97 %. Body mass index is 25.66 kg/m.  General appearance: alert, no  distress, WD/WN, female Cognitive Testing  Alert? Yes  Normal Appearance?Yes  Oriented to person? Yes  Place? Yes   Time? Yes  Recall of three objects?  Yes  Can perform simple calculations? Yes  Displays appropriate judgment?Yes  Can read the correct time from a watch face?Yes HEENT: normocephalic, sclerae anicteric, TMs pearly, nares patent, no discharge or erythema, pharynx normal Oral cavity: MMM, no lesions Neck: supple, no lymphadenopathy, no thyromegaly, no masses Heart: RRR, normal S1, S2, no murmurs Lungs: CTA bilaterally, no wheezes, rhonchi, or rales Abdomen: +bs, soft, non tender, non distended, no masses, no hepatomegaly, no splenomegaly Musculoskeletal: nontender, no swelling, no obvious deformity Extremities: no edema, no cyanosis, no clubbing Pulses: 2+ symmetric, upper and lower extremities, normal cap refill Neurological: alert, oriented x 3, CN2-12 intact, strength normal upper extremities and lower extremities, sensation normal throughout, DTRs 2+ throughout, no cerebellar signs, gait normal   Psychiatric: normal affect, behavior normal, pleasant   Assessment:  Patient denies any difficulties at home. No trouble with ADLs, depression or falls. No recent changes to vision or hearing. Is UTD with immunizations. Is UTD  with screening. Discussed Advanced Directives, patient agrees to bring [place] copies of documents if can. Encouraged heart healthy diet, exercise as tolerated and adequate sleep.    Plan:   During the course of the visit the patient was educated and counseled about appropriate screening and preventive services including:    Pneumococcal vaccine   Influenza vaccine  Td vaccine  Screening electrocardiogram  Bone densitometry screening  Colorectal cancer screening  Diabetes screening  Glaucoma screening  Nutrition counseling   Advanced directives: requested  Screening recommendations, referrals: Vaccinations: Please see documentation below and orders this visit.  Nutrition assessed and recommended  Colonoscopy not indicated Recommended yearly ophthalmology/optometry visit for glaucoma screening and checkup Recommended yearly dental visit for hygiene and checkup Advanced directives - requested  Conditions/risks identified: BMI: Discussed weight loss, diet, and increase physical activity.  Increase physical activity: AHA recommends 150 minutes of physical activity a week.  Medications reviewed Urinary Incontinence is not an issue: discussed non pharmacology and pharmacology options.  Fall risk: Low  Encounter for Medicare annual wellness exam - EKG 12-Lead - HgB A1c - Lipid Panel - Vitamin D, 25-hydroxy   Immunization due - Pneumococcal conjugate vaccine 13-valent Medicare Attestation I have personally reviewed: The patient's medical and social history Their use of alcohol, tobacco or illicit drugs Their current medications and supplements The patient's functional ability including ADLs,fall risks, home safety risks, cognitive, and hearing and visual impairment Diet and physical activities Evidence for depression or mood disorders  The patient's weight, height, BMI, and visual acuity have been recorded in the chart.  I have made referrals, counseling, and provided  education to the patient based on review of the above and I have provided the patient with a written personalized care plan for preventive services.     Robbi Scurlock Jerilynn Mages, FNP   11/23/2016

## 2016-11-24 LAB — LIPID PANEL
CHOLESTEROL: 182 mg/dL (ref <200)
HDL: 53 mg/dL (ref >50)
LDL Cholesterol: 111 mg/dL — ABNORMAL HIGH (ref <100)
TRIGLYCERIDES: 92 mg/dL (ref <150)
Total CHOL/HDL Ratio: 3.4 Ratio (ref <5.0)
VLDL: 18 mg/dL (ref <30)

## 2016-11-24 LAB — VITAMIN D 25 HYDROXY (VIT D DEFICIENCY, FRACTURES): Vit D, 25-Hydroxy: 32 ng/mL (ref 30–100)

## 2016-11-29 ENCOUNTER — Encounter: Payer: Self-pay | Admitting: Internal Medicine

## 2016-11-29 ENCOUNTER — Ambulatory Visit (INDEPENDENT_AMBULATORY_CARE_PROVIDER_SITE_OTHER): Payer: Medicare Other | Admitting: Internal Medicine

## 2016-11-29 VITALS — BP 128/80 | HR 109 | Ht 66.0 in | Wt 159.0 lb

## 2016-11-29 DIAGNOSIS — J45991 Cough variant asthma: Secondary | ICD-10-CM

## 2016-11-29 MED ORDER — PREDNISONE 10 MG PO TABS: ORAL_TABLET | ORAL | 0 refills | Status: DC

## 2016-11-29 MED ORDER — TRAMADOL HCL 50 MG PO TABS: ORAL_TABLET | ORAL | 0 refills | Status: DC

## 2016-11-29 NOTE — Patient Instructions (Addendum)
Please see patient coordinator before you leave today  to schedule High resolution CT chest   The key to effective treatment for your cough is eliminating the non-stop cycle of cough you're stuck in long enough to let your airway heal completely and then see if there is anything still making you cough once you stop the cough suppression, but this should take no more than 5 days to figure out  First take delsym two tsp every 12 hours and supplement if needed with  tramadol 50 mg up to 2 every 4 hours to suppress the urge to cough at all or even clear your throat. Swallowing water or using ice chips/non mint and menthol containing candies (such as lifesavers or sugarless jolly ranchers) are also effective.  You should rest your voice and avoid activities that you know make you cough.  Once you have eliminated the cough for 3 straight days try reducing the tramadol first,  then the delsym as tolerated.    Prednisone 10 mg take  4 each am x 2 days,   2 each am x 2 days,  1 each am x 2 days and stop (this is to eliminate allergies and inflammation from coughing)  Protonix (pantoprazole) Take 30-60 min before first meal of the day and Pepcid 20 mg one bedtime plus chlorpheniramine 4 mg x 2 at bedtime (both available over the counter)  until cough is completely gone for at least a week without the need for cough suppression  GERD (REFLUX)  is an extremely common cause of respiratory symptoms, many times with no significant heartburn at all.    It can be treated with medication, but also with lifestyle changes including avoidance of late meals, excessive alcohol, smoking cessation, and avoid fatty foods, chocolate, peppermint, colas, red wine, and acidic juices such as orange juice.  NO MINT OR MENTHOL PRODUCTS SO NO COUGH DROPS   USE HARD CANDY INSTEAD (jolley ranchers or Stover's or Lifesavers (all available in sugarless versions) NO OIL BASED VITAMINS - use powdered substitutes.   Please schedule a  follow up office visit in 3 weeks, sooner if needed with PFT's first

## 2016-11-29 NOTE — Progress Notes (Signed)
Subjective:    Patient ID: Lorraine Blair, female    DOB: 1951-08-16    MRN: [id number]    Brief patient profile:  49 yobf  Quit smoking in 1989  With intermittentsinus infections maybe twice a year and gradually improving  until the day after arrived back from [place] Aug [id number]  severe productive cough / assoc with chest congestion and post drainage x 2 weeks > lots of white mucus seemed to improve except for harsh barking quality and present ever since so referred to pulmonary clinic 11/03/2016 by  Carlton Adam NP/ has RA refer to [facility]    11/03/2016 1st [facility] office visit/ Wert   Chief Complaint  Patient presents with  . Pulmonary Consult    Dr. Balinda Quails referred pt, cough since August 2016, ENT said she did not have GERD, tramadol helped with the coughing, cough on and off today, early mornings coughs up clear/yellow mucus  pt unclear on date of onset but records indicate  jan 2017 sought care for cough of duration x  6 months and rx with zyrtec and and something for reflux but no better / cough worse when wakes up each am but sleeps fine p taking tramadol and really no excess or overtly purulent am sputum and cough tends to worsen as day goes on unless takes tramadol with sensation of pnds but no rhinorhea.  Has h/o severe sinus infections ever since adult so went to [facility]  ENT July 2017 > ? Might be reflux  rx tramadol up to twice daily seems to help  rec First take delsym two tsp every 12 hours and supplement if needed with  tramadol 50 mg up to 2 every 4 hours to suppress the urge to cough at all or even clear your throat.    Prednisone 10 mg take  4 each am x 2 days,   2 each am x 2 days,  1 each am x 2 days and stop (this is to eliminate allergies and inflammation from coughing) Protonix (pantoprazole) Take 30-60 min before first meal of the day and Pepcid 20 mg one bedtime plus chlorpheniramine 4 mg x 2 at bedtime (both available over the counter)   until cough is completely gone for at least a week without the need for cough suppression GERD diet      11/29/2016  f/u ov/Wert re:  Cough x aug 2016  Chief Complaint  Patient presents with  . Follow-up    Cough had improved on prednisone and then worsened once she finished med. She has noticed that she coughs every time she bends over and the more she talks. Cough is occ prod with minimal clear sputum.    cough resolved 3 d into protocol  but continued the tramadol until it ran out and then cough recurred  Still on ppi qam / h2hs  Cough is worse after supper / not typically waking her/ more related to voice use   No obvious day to day or daytime variability or assoc sob or purulent sputum/  mucus plugs or hemoptysis or cp or chest tightness, subjective wheeze or overt sinus or hb symptoms. No unusual exp hx or h/o childhood pna/ asthma or knowledge of premature birth.  Sleeping ok without nocturnal  or early am exacerbation  of respiratory  c/o's or need for noct saba. Also denies any obvious fluctuation of symptoms with weather or environmental changes or other aggravating or alleviating factors except as outlined above  Current Medications, Allergies, Complete Past Medical History, Past Surgical History, Family History, and Social History were reviewed in [employer] record.  ROS  The following are not active complaints unless bolded sore throat, dysphagia, dental problems, itching, sneezing,  nasal congestion or excess/ purulent secretions, ear ache,   fever, chills, sweats, unintended wt loss, classically pleuritic or exertional cp,  orthopnea pnd or leg swelling, presyncope, palpitations, abdominal pain, anorexia, nausea, vomiting, diarrhea  or change in bowel or bladder habits, change in stools or urine, dysuria,hematuria,  rash, arthralgias, visual complaints, headache, numbness, weakness or ataxia or problems with walking or coordination,  change in mood/affect  or memory.               Objective:   Physical Exam  amb bf nad   . 11/29/2016    11/03/16 162 lb 3.2 oz (73.6 kg)  10/24/16 158 lb (71.7 kg)  05/02/16 163 lb (73.9 kg)    Vital signs reviewed  - Note on arrival 02 sats  98% on RA     HEENT: nl dentition,  and oropharynx which is pristine.. Nl external ear canals without cough reflex - moderate bilateral non-specific turbinate edema     NECK :  without JVD/Nodes/TM/ nl carotid upstrokes bilaterally   LUNGS: no acc muscle use,  Nl contour chest with end insp crackles bilaterally  with cough on insp   CV:  RRR  no s3 or murmur or increase in P2, nad no edema   ABD:  soft and nontender with nl inspiratory excursion in the supine position. No bruits or organomegaly appreciated, bowel sounds nl  MS:  Nl gait/ ext warm without deformities, calf tenderness, cyanosis or clubbing No obvious joint restrictions   SKIN: warm and dry without lesions    NEURO:  alert, approp, nl sensorium with  no motor or cerebellar deficits apparent.    CXR PA and Lateral:   11/03/2016 :    I personally reviewed images and agree with radiology impression as follows:   Interstitial prominence diffusely suggest pulmonary fibrosis. Chest CT scanning is recommended.      Dg Es 08/10/12 Pos HH s gerd     Assessment & Plan:

## 2016-12-05 ENCOUNTER — Ambulatory Visit (INDEPENDENT_AMBULATORY_CARE_PROVIDER_SITE_OTHER)
Admission: RE | Admit: 2016-12-05 | Discharge: 2016-12-05 | Disposition: A | Discharge status: Home / Self Care | Payer: Medicare Other | Source: Ambulatory Visit | Attending: Internal Medicine | Admitting: Internal Medicine

## 2016-12-05 ENCOUNTER — Encounter: Payer: Self-pay | Admitting: Internal Medicine

## 2016-12-05 DIAGNOSIS — J8410 Pulmonary fibrosis, unspecified: Secondary | ICD-10-CM | POA: Insufficient documentation

## 2016-12-05 DIAGNOSIS — J45991 Cough variant asthma: Secondary | ICD-10-CM | POA: Diagnosis not present

## 2016-12-05 NOTE — Assessment & Plan Note (Addendum)
FENO 11/03/2016  =   21 Allergy profile 11/03/2016 >  Eos 0.1 /  IgE  2 rast neg - cyclical cough regimen with 6 days of pred 11/03/2016 > transient improvement but did not stop tramadol once cough resolved then cough relapsed off tramadol   Lack of cough resolution on a verified empirical regimen could mean an alternative diagnosis (ILD suggested by cough on insp and cxr > needs hrct)  persistence of the disease state (eg sinusitis or bronchiectasis- also r/o with hrct) , or inadequacy of currently available therapy (eg no medical rx available for non-acid gerd - always a concern in pt with HH and chronic cough and ILD)   For now plan is to repeat the same protocol but this time s tramadol once the coughing stops and check hrct chest and return with full pfts next   I had an extended discussion with the patient reviewing all relevant studies completed to date and  lasting 15 to 20 minutes of a 25 minute visit    Each maintenance medication was reviewed in detail including most importantly the difference between maintenance and prns and under what circumstances the prns are to be triggered using an action plan format that is not reflected in the computer generated alphabetically organized AVS.    Please see AVS for specific instructions unique to this visit that I personally wrote and verbalized to the the pt in detail and then reviewed with pt  by my nurse highlighting any  changes in therapy recommended at today's visit to their plan of care.

## 2016-12-06 NOTE — Progress Notes (Signed)
Spoke with pt and notified of results per Dr. Wert. Pt verbalized understanding and denied any questions. 

## 2016-12-27 DIAGNOSIS — M3501 Sicca syndrome with keratoconjunctivitis: Secondary | ICD-10-CM | POA: Insufficient documentation

## 2017-01-02 ENCOUNTER — Ambulatory Visit: Payer: Medicare Other | Admitting: Family Medicine

## 2017-01-02 ENCOUNTER — Ambulatory Visit (INDEPENDENT_AMBULATORY_CARE_PROVIDER_SITE_OTHER): Payer: Medicare Other | Admitting: Internal Medicine

## 2017-01-02 ENCOUNTER — Encounter: Payer: Self-pay | Admitting: Internal Medicine

## 2017-01-02 VITALS — BP 116/68 | HR 112 | Ht 66.0 in | Wt 155.0 lb

## 2017-01-02 DIAGNOSIS — J45991 Cough variant asthma: Secondary | ICD-10-CM

## 2017-01-02 DIAGNOSIS — J8410 Pulmonary fibrosis, unspecified: Secondary | ICD-10-CM

## 2017-01-02 LAB — PULMONARY FUNCTION TEST
DL/VA % PRED: 72 %
DL/VA: 3.64 ml/min/mmHg/L
DLCO cor % pred: 47 %
DLCO cor: 12.88 ml/min/mmHg
DLCO unc % pred: 46 %
DLCO unc: 12.47 ml/min/mmHg
FEF 25-75 PRE: 3.09 L/s
FEF 25-75 Post: 3.74 L/sec
FEF2575-%CHANGE-POST: 21 %
FEF2575-%Pred-Post: 188 %
FEF2575-%Pred-Pre: 155 %
FEV1-%Change-Post: 0 %
FEV1-%PRED-PRE: 106 %
FEV1-%Pred-Post: 106 %
FEV1-PRE: 2.26 L
FEV1-Post: 2.25 L
FEV1FVC-%CHANGE-POST: 0 %
FEV1FVC-%Pred-Pre: 114 %
FEV6-%CHANGE-POST: -2 %
FEV6-%PRED-PRE: 95 %
FEV6-%Pred-Post: 93 %
FEV6-POST: 2.45 L
FEV6-PRE: 2.51 L
FEV6FVC-%Change-Post: 0 %
FEV6FVC-%PRED-POST: 103 %
FEV6FVC-%Pred-Pre: 102 %
FVC-%Change-Post: 0 %
FVC-%PRED-POST: 92 %
FVC-%PRED-PRE: 92 %
FVC-POST: 2.52 L
FVC-Pre: 2.52 L
POST FEV6/FVC RATIO: 100 %
Post FEV1/FVC ratio: 89 %
Pre FEV1/FVC ratio: 89 %
Pre FEV6/FVC Ratio: 100 %
RV % pred: 48 %
RV: 1.07 L
TLC % pred: 63 %
TLC: 3.4 L

## 2017-01-02 MED ORDER — GABAPENTIN 100 MG PO CAPS: 100.0000 mg | ORAL_CAPSULE | Freq: Three times a day (TID) | ORAL | 2 refills | Status: DC

## 2017-01-02 NOTE — Assessment & Plan Note (Signed)
FENO 11/03/2016  =   21 Allergy profile 11/03/2016 >  Eos 0.1 /  IgE  2 rast neg - cyclical cough regimen with 6 days of pred 11/03/2016 > transient improvement but did not stop tramadol once cough resolved then cough relapsed off tramadol and did not resp to pred - gabapentin 100 mg tid 01/02/2017 >>>   Pattern is strongly against asthma and in favor of  Upper airway cough syndrome (previously labeled PNDS) , is  so named because it's frequently impossible to sort out how much is  CR/sinusitis with freq throat clearing (which can be related to primary GERD)   vs  causing  secondary (" extra esophageal")  GERD from wide swings in gastric pressure that occur with throat clearing, often  promoting self use of mint and menthol lozenges that reduce the lower esophageal sphincter tone and exacerbate the problem further in a cyclical fashion.   These are the same pts (now being labeled as having "irritable larynx syndrome" by some cough centers) who not infrequently have a history of having failed to tolerate ace inhibitors,  dry powder inhalers or biphosphonates or report having atypical/extraesophageal reflux symptoms that don't respond to standard doses of PPI  and are easily confused as having aecopd or asthma flares by even experienced allergists/ pulmonologists (myself included).   rec continue rx for cough and add gabapentin 100 tid then return with all meds in hand using a trust but verify approach to confirm accurate Medication  Reconciliation The principal here is that until we are certain that the  patients are doing what we've asked, it makes no sense to ask them to do more.

## 2017-01-02 NOTE — Patient Instructions (Addendum)
Start gabapentin 100 mg three times daily   Please schedule a follow up office visit in 4 weeks, sooner if needed - with all meds in hand

## 2017-01-02 NOTE — Assessment & Plan Note (Signed)
HRCT chest  12/05/2016 1. 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 2. Nonspecific mild mediastinal lymphadenopathy, probably reactive. 3. Aortic atherosclerosis. 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 4. Left main and 3 vessel coronary atherosclerosis. 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    Please see AVS for specific instructions unique to this visit that I personally wrote and verbalized to the the pt in detail and then reviewed with pt  by my nurse highlighting any  changes in therapy recommended at today's visit to their plan of care.

## 2017-01-02 NOTE — Progress Notes (Signed)
Subjective:    Patient ID: Lorraine Blair, female    DOB: 1951/11/02    MRN: [id number]    Brief patient profile:  89 yobf  Quit smoking in 1989  With intermittent sinus infections maybe twice a year and gradually improving  until the day after arrived back from [place] Aug [id number]  severe productive cough / assoc with chest congestion and post drainage x 2 weeks > lots of white mucus seemed to improve except for harsh barking quality and present ever since so referred to pulmonary clinic 11/03/2016 by  Carlton Adam NP/ has RA refer to [facility]    11/03/2016 1st [facility] office visit/ Wylene Weissman   Chief Complaint  Patient presents with  . Pulmonary Consult    Dr. Balinda Quails referred pt, cough since August 2016, ENT said she did not have GERD, tramadol helped with the coughing, cough on and off today, early mornings coughs up clear/yellow mucus  pt unclear on date of onset but records indicate  jan 2017 sought care for cough of duration x  6 months and rx with zyrtec and and something for reflux but no better / cough worse when wakes up each am but sleeps fine p taking tramadol and really no excess or overtly purulent am sputum and cough tends to worsen as day goes on unless takes tramadol with sensation of pnds but no rhinorhea.  Has h/o severe sinus infections ever since adult so went to [facility]  ENT July 2017 > ? Might be reflux  rx tramadol up to twice daily seems to help  rec First take delsym two tsp every 12 hours and supplement if needed with  tramadol 50 mg up to 2 every 4 hours to suppress the urge to cough at all or even clear your throat.    Prednisone 10 mg take  4 each am x 2 days,   2 each am x 2 days,  1 each am x 2 days and stop (this is to eliminate allergies and inflammation from coughing) Protonix (pantoprazole) Take 30-60 min before first meal of the day and Pepcid 20 mg one bedtime plus chlorpheniramine 4 mg x 2 at bedtime (both available over the counter)   until cough is completely gone for at least a week without the need for cough suppression GERD diet      11/29/2016  f/u ov/Tevita Gomer re:  Cough x aug 2016  Chief Complaint  Patient presents with  . Follow-up    Cough had improved on prednisone and then worsened once she finished med. She has noticed that she coughs every time she bends over and the more she talks. Cough is occ prod with minimal clear sputum.    cough resolved 3 d into protocol  but continued the tramadol until it ran out and then cough recurred  Still on ppi qam / h2hs  Cough is worse after supper / not typically waking her/ more related to voice use rec Please see patient coordinator before you leave today  to schedule High resolution CT chest  First take delsym two tsp every 12 hours and supplement if needed with  tramadol 50 mg up to 2 every 4 hours to suppress the urge to cough at all or even clear your throat. Swallowing water or using ice chips/non mint and menthol containing candies (such as lifesavers or sugarless jolly ranchers) are also effective.  You should rest your voice and avoid activities that you know make you cough.  Once you have eliminated the cough for 3 straight days try reducing the tramadol first,  then the delsym as tolerated.   Prednisone 10 mg take  4 each am x 2 days,   2 each am x 2 days,  1 each am x 2 days and stop (this is to eliminate allergies and inflammation from coughing) Protonix (pantoprazole) Take 30-60 min before first meal of the day and Pepcid 20 mg one bedtime plus chlorpheniramine 4 mg x 2 at bedtime (both available over the counter)  until cough is completely gone for at least a week without the need for cough suppression GERD diet      01/02/2017  f/u ov/Coraline Talwar re: uacs/ pf in pt with RA  Chief Complaint  Patient presents with  . Follow-up    PFT's done today. Cough has been worse since developing a cold. She is coughing up minimal clear sputum.     onset of cough 12/30/16 sore throat  while on ppi qam and h2 hs and prednisone 20 Prior to uri already had 24/7 cough and it worsened p being eliminated x 3 days with tramadol while still on prednisone/ ppi/h2hs     No obvious day to day or daytime variability or assoc sob or purulent sputum/  mucus plugs or hemoptysis or cp or chest tightness, subjective wheeze or overt sinus or hb symptoms. No unusual exp hx or h/o childhood pna/ asthma or knowledge of premature birth.  Sleeping ok without nocturnal  or early am exacerbation  of respiratory  c/o's or need for noct saba. Also denies any obvious fluctuation of symptoms with weather or environmental changes or other aggravating or alleviating factors except as outlined above   Current Medications, Allergies, Complete Past Medical History, Past Surgical History, Family History, and Social History were reviewed in [employer] record.  ROS  The following are not active complaints unless bolded sore throat, dysphagia, dental problems, itching, sneezing,  nasal congestion or excess/ purulent secretions, ear ache,   fever, chills, sweats, unintended wt loss, classically pleuritic or exertional cp,  orthopnea pnd or leg swelling, presyncope, palpitations, abdominal pain, anorexia, nausea, vomiting, diarrhea  or change in bowel or bladder habits, change in stools or urine, dysuria,hematuria,  rash, arthralgias better on pred, visual complaints, headache, numbness, weakness or ataxia or problems with walking or coordination,  change in mood/affect or memory.               Objective:   Physical Exam  amb bf nad   . 01/02/2017       155      11/03/16 162 lb 3.2 oz (73.6 kg)  10/24/16 158 lb (71.7 kg)  05/02/16 163 lb (73.9 kg)    Vital signs reviewed  - Note on arrival 02 sats  96% on RA     HEENT: nl dentition,  and oropharynx which is pristine.. Nl external ear canals without cough reflex - moderate bilateral non-specific turbinate edema     NECK :  without  JVD/Nodes/TM/ nl carotid upstrokes bilaterally   LUNGS: no acc muscle use,  Nl contour chest with end insp crackles bilaterally  with cough on insp   CV:  RRR  no s3 or murmur or increase in P2, nad no edema   ABD:  soft and nontender with nl inspiratory excursion in the supine position. No bruits or organomegaly appreciated, bowel sounds nl  MS:  Nl gait/ ext warm without deformities, calf tenderness, cyanosis or clubbing  No obvious joint restrictions   SKIN: warm and dry without lesions    NEURO:  alert, approp, nl sensorium with  no motor or cerebellar deficits apparent.      I personally reviewed images and agree with radiology impression as follows:   HRCT Chest  12/05/16  1. 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 2. Nonspecific mild mediastinal lymphadenopathy, probably reactive. 3. Aortic atherosclerosis. 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 4. Left main and 3 vessel coronary atherosclerosis.     Dg Es 08/10/12 Pos HH s gerd     Assessment & Plan:

## 2017-01-30 ENCOUNTER — Ambulatory Visit: Payer: Medicare Other | Admitting: Internal Medicine

## 2017-01-31 ENCOUNTER — Encounter: Payer: Self-pay | Admitting: Internal Medicine

## 2017-01-31 ENCOUNTER — Ambulatory Visit (INDEPENDENT_AMBULATORY_CARE_PROVIDER_SITE_OTHER): Payer: Medicare Other | Admitting: Internal Medicine

## 2017-01-31 VITALS — BP 124/76 | HR 111 | Ht 66.0 in | Wt 159.0 lb

## 2017-01-31 DIAGNOSIS — J45991 Cough variant asthma: Secondary | ICD-10-CM | POA: Diagnosis not present

## 2017-01-31 DIAGNOSIS — J8410 Pulmonary fibrosis, unspecified: Secondary | ICD-10-CM

## 2017-01-31 NOTE — Patient Instructions (Addendum)
No change in medications  Return first week in Aug 2018 for PFTs - call sooner if worse cough or losing ground with bicycle exercise

## 2017-01-31 NOTE — Progress Notes (Signed)
Subjective:    Patient ID: Lorraine Blair, female    DOB: Apr 26, 1951    MRN: [id number]    Brief patient profile:  30 yobf  Quit smoking in 1989  With intermittent sinus infections maybe twice a year and gradually improving  until the day after arrived back from [place] Aug 6389  severe productive cough / assoc with chest congestion and post drainage x 2 weeks > lots of white mucus seemed to improve except for harsh barking quality and present ever since so referred to pulmonary clinic 11/03/2016 by  Carlton Adam NP/ has RA refer to [facility]    11/03/2016 1st [place] [facility] office visit/ Wert   Chief Complaint  Patient presents with  . Pulmonary Consult    Dr. Balinda Quails referred pt, cough since August 2016, ENT said she did not have GERD, tramadol helped with the coughing, cough on and off today, early mornings coughs up clear/yellow mucus  pt unclear on date of onset but records indicate  jan 2017 sought care for cough of duration x  6 months and rx with zyrtec and and something for reflux but no better / cough worse when wakes up each am but sleeps fine p taking tramadol and really no excess or overtly purulent am sputum and cough tends to worsen as day goes on unless takes tramadol with sensation of pnds but no rhinorhea.  Has h/o severe sinus infections ever since adult so went to [facility]  ENT July 2017 > ? Might be reflux  rx tramadol up to twice daily seems to help  rec First take delsym two tsp every 12 hours and supplement if needed with  tramadol 50 mg up to 2 every 4 hours to suppress the urge to cough at all or even clear your throat.    Prednisone 10 mg take  4 each am x 2 days,   2 each am x 2 days,  1 each am x 2 days and stop (this is to eliminate allergies and inflammation from coughing) Protonix (pantoprazole) Take 30-60 min before first meal of the day and Pepcid 20 mg one bedtime plus chlorpheniramine 4 mg x 2 at bedtime (both available over the counter)   until cough is completely gone for at least a week without the need for cough suppression GERD diet      11/29/2016  f/u ov/Wert re:  Cough x aug 2016  Chief Complaint  Patient presents with  . Follow-up    Cough had improved on prednisone and then worsened once she finished med. She has noticed that she coughs every time she bends over and the more she talks. Cough is occ prod with minimal clear sputum.    cough resolved 3 d into protocol  but continued the tramadol until it ran out and then cough recurred  Still on ppi qam / h2hs  Cough is worse after supper / not typically waking her/ more related to voice use rec Please see patient coordinator before you leave today  to schedule High resolution CT chest  First take delsym two tsp every 12 hours and supplement if needed with  tramadol 50 mg up to 2 every 4 hours to suppress the urge to cough at all or even clear your throat. Swallowing water or using ice chips/non mint and menthol containing candies (such as lifesavers or sugarless jolly ranchers) are also effective.  You should rest your voice and avoid activities that you know make you cough.  Once you have eliminated the cough for 3 straight days try reducing the tramadol first,  then the delsym as tolerated.   Prednisone 10 mg take  4 each am x 2 days,   2 each am x 2 days,  1 each am x 2 days and stop (this is to eliminate allergies and inflammation from coughing) Protonix (pantoprazole) Take 30-60 min before first meal of the day and Pepcid 20 mg one bedtime plus chlorpheniramine 4 mg x 2 at bedtime (both available over the counter)  until cough is completely gone for at least a week without the need for cough suppression GERD diet      01/02/2017  f/u ov/Wert re: uacs/ pf in pt with RA  Chief Complaint  Patient presents with  . Follow-up    PFT's done today. Cough has been worse since developing a cold. She is coughing up minimal clear sputum.     onset of cough 12/30/16 sore throat  while on ppi qam and h2 hs and prednisone 20 Prior to uri already had 24/7 cough and it worsened p being eliminated x 3 days with tramadol while still on prednisone/ ppi/h2hs rec Start gabapentin 100 mg three times daily  Please schedule a follow up office visit in 4 weeks, sooner if needed - with all meds in hand> did not do     01/31/2017  f/u ov/Wert re: PF from RA with UACS on gabapentin 100 mg tid  Chief Complaint  Patient presents with  . Follow-up    Breathing is doing well. She states her cough is much improved,  but not completely resolved. She has noticed that she coughs when she talks excessively, and when she bends over. She also coughs for approx 5 min after she lies down.    rides stationery bike up to 30 min at a time  pred at 10 mg daily until sees [facility]  Rheum   No obvious day to day or daytime variability or assoc excess/ purulent sputum or mucus plugs or hemoptysis or cp or chest tightness, subjective wheeze or overt sinus or hb symptoms. No unusual exp hx or h/o childhood pna/ asthma or knowledge of premature birth.  Sleeping ok without nocturnal  or early am exacerbation  of respiratory  c/o's or need for noct saba. Also denies any obvious fluctuation of symptoms with weather or environmental changes or other aggravating or alleviating factors except as outlined above   Current Medications, Allergies, Complete Past Medical History, Past Surgical History, Family History, and Social History were reviewed in [employer] record.  ROS  The following are not active complaints unless bolded sore throat, dysphagia, dental problems, itching, sneezing,  nasal congestion or excess/ purulent secretions, ear ache,   fever, chills, sweats, unintended wt loss, classically pleuritic or exertional cp,  orthopnea pnd or leg swelling, presyncope, palpitations, abdominal pain, anorexia, nausea, vomiting, diarrhea  or change in bowel or bladder habits, change in stools or  urine, dysuria,hematuria,  rash, arthralgias, visual complaints, headache, numbness, weakness or ataxia or problems with walking or coordination,  change in mood/affect or memory.                   Objective:   Physical Exam  amb bf nad     01/31/2017       159  01/02/2017       155      11/03/16 162 lb 3.2 oz (73.6 kg)  10/24/16 158 lb (71.7 kg)  05/02/16 163 lb (73.9 kg)    Vital signs reviewed  - Note on arrival 02 sats  95% on RA     HEENT: nl dentition,  and oropharynx which is pristine.. Nl external ear canals without cough reflex - moderate bilateral non-specific turbinate edema     NECK :  without JVD/Nodes/TM/ nl carotid upstrokes bilaterally   LUNGS: no acc muscle use,  Nl contour chest with end insp crackles bilaterally  But no  cough on insp   CV:  RRR  no s3 or murmur or increase in P2, nad no edema   ABD:  soft and nontender with nl inspiratory excursion in the supine position. No bruits or organomegaly appreciated, bowel sounds nl  MS:  Nl gait/ ext warm without deformities, calf tenderness, cyanosis or clubbing No obvious joint restrictions   SKIN: warm and dry without lesions    NEURO:  alert, approp, nl sensorium with  no motor or cerebellar deficits apparent.      I personally reviewed images and agree with radiology impression as follows:   HRCT Chest  12/05/16  1. 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 2. Nonspecific mild mediastinal lymphadenopathy, probably reactive. 3. Aortic atherosclerosis. 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 4. Left main and 3 vessel coronary atherosclerosis.     Dg Es 08/10/12 Pos HH s gerd     Assessment & Plan:

## 2017-02-05 NOTE — Assessment & Plan Note (Addendum)
FENO 11/03/2016  =   21 Allergy profile 11/03/2016 >  Eos 0.1 /  IgE  2 rast neg - cyclical cough regimen with 6 days of pred 11/03/2016 > transient improvement but did not stop tramadol once cough resolved then cough relapsed off tramadol and did not resp to pred - gabapentin 100 mg tid 01/02/2017 >>>  Improved 01/31/2017   Adequate control on present rx, reviewed in detail with pt > no change in rx needed    Each maintenance medication was reviewed in detail including most importantly the difference between maintenance and as needed and under what circumstances the prns are to be used.  Please see AVS for specific  Instructions which are unique to this visit and I personally typed out  which were reviewed in detail in writing with the patient and a copy provided.

## 2017-02-05 NOTE — Assessment & Plan Note (Addendum)
HRCT chest  12/05/2016 1. 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 2. Nonspecific mild mediastinal lymphadenopathy, probably reactive. 3. Aortic atherosclerosis. 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 4. Left main and 3 vessel coronary atherosclerosis. - PFT's  01/02/2017  FVC  2.52 (92%) s obst p nothing prior to study with DLCO  46/47c  % corrects to 72 % for alv volume     No evidence of dz progression/ improving ex tol > f/u q 6 m planned unless decline in ex tol - key to prognosis is control of systemic dz

## 2017-02-21 ENCOUNTER — Other Ambulatory Visit: Payer: Self-pay | Admitting: Internal Medicine

## 2017-02-21 DIAGNOSIS — J45991 Cough variant asthma: Secondary | ICD-10-CM

## 2017-05-11 ENCOUNTER — Other Ambulatory Visit: Payer: Self-pay | Admitting: Internal Medicine

## 2017-05-11 DIAGNOSIS — J45991 Cough variant asthma: Secondary | ICD-10-CM

## 2017-05-23 ENCOUNTER — Ambulatory Visit: Payer: Medicare Other | Admitting: Family Medicine

## 2017-05-31 ENCOUNTER — Other Ambulatory Visit: Payer: Self-pay | Admitting: Internal Medicine

## 2017-05-31 DIAGNOSIS — J45991 Cough variant asthma: Secondary | ICD-10-CM

## 2017-06-08 ENCOUNTER — Ambulatory Visit: Payer: Medicare Other | Admitting: Family Medicine

## 2017-06-21 ENCOUNTER — Encounter: Payer: Self-pay | Admitting: Internal Medicine

## 2017-06-21 ENCOUNTER — Ambulatory Visit (INDEPENDENT_AMBULATORY_CARE_PROVIDER_SITE_OTHER): Payer: Medicare Other | Admitting: Internal Medicine

## 2017-06-21 ENCOUNTER — Other Ambulatory Visit: Payer: Self-pay | Admitting: Internal Medicine

## 2017-06-21 ENCOUNTER — Ambulatory Visit (INDEPENDENT_AMBULATORY_CARE_PROVIDER_SITE_OTHER)
Admission: RE | Admit: 2017-06-21 | Discharge: 2017-06-21 | Disposition: A | Discharge status: Home / Self Care | Payer: Medicare Other | Source: Ambulatory Visit | Attending: Internal Medicine | Admitting: Internal Medicine

## 2017-06-21 VITALS — BP 126/74 | HR 86 | Ht 66.0 in | Wt 157.0 lb

## 2017-06-21 DIAGNOSIS — J45991 Cough variant asthma: Secondary | ICD-10-CM | POA: Diagnosis not present

## 2017-06-21 DIAGNOSIS — R0600 Dyspnea, unspecified: Secondary | ICD-10-CM

## 2017-06-21 DIAGNOSIS — J8410 Pulmonary fibrosis, unspecified: Secondary | ICD-10-CM

## 2017-06-21 LAB — PULMONARY FUNCTION TEST
DL/VA % PRED: 63 %
DL/VA: 3.2 ml/min/mmHg/L
DLCO UNC % PRED: 41 %
DLCO cor % pred: 42 %
DLCO cor: 11.4 ml/min/mmHg
DLCO unc: 11.22 ml/min/mmHg
FEF 25-75 POST: 2.64 L/s
FEF 25-75 Pre: 3.37 L/sec
FEF2575-%Change-Post: -21 %
FEF2575-%Pred-Post: 133 %
FEF2575-%Pred-Pre: 171 %
FEV1-%CHANGE-POST: -2 %
FEV1-%Pred-Post: 103 %
FEV1-%Pred-Pre: 105 %
FEV1-PRE: 2.23 L
FEV1-Post: 2.19 L
FEV1FVC-%Change-Post: 3 %
FEV1FVC-%PRED-PRE: 114 %
FEV6-%Change-Post: -4 %
FEV6-%PRED-PRE: 95 %
FEV6-%Pred-Post: 91 %
FEV6-POST: 2.38 L
FEV6-PRE: 2.5 L
FEV6FVC-%Change-Post: 0 %
FEV6FVC-%PRED-POST: 104 %
FEV6FVC-%PRED-PRE: 104 %
FVC-%CHANGE-POST: -5 %
FVC-%PRED-PRE: 92 %
FVC-%Pred-Post: 87 %
FVC-POST: 2.38 L
FVC-PRE: 2.51 L
POST FEV6/FVC RATIO: 100 %
PRE FEV6/FVC RATIO: 100 %
Post FEV1/FVC ratio: 92 %
Pre FEV1/FVC ratio: 89 %
RV % pred: 44 %
RV: 0.98 L
TLC % pred: 62 %
TLC: 3.36 L

## 2017-06-21 MED ORDER — BENZONATATE 200 MG PO CAPS: 200.0000 mg | ORAL_CAPSULE | Freq: Three times a day (TID) | ORAL | 11 refills | Status: DC | PRN

## 2017-06-21 MED ORDER — GABAPENTIN 300 MG PO CAPS: 300.0000 mg | ORAL_CAPSULE | Freq: Three times a day (TID) | ORAL | 11 refills | Status: DC

## 2017-06-21 NOTE — Patient Instructions (Addendum)
Increase gabapentin 300mg  three times  Daily   For cough > tessalon 200 mg three times daily as needed but if the gabapentin works you should not need the tessalon  Please schedule a follow up visit in 6  months but call sooner if needed with 6 min walk on return

## 2017-06-21 NOTE — Progress Notes (Signed)
PFT done today. 

## 2017-06-21 NOTE — Progress Notes (Signed)
Subjective:    Patient ID: Lorraine Blair, female    DOB: [birth year]    MRN: [id number]    Brief patient profile:  38 yobf  Quit smoking in 1989  With intermittent sinus infections maybe twice a year and gradually improving  until the day after arrived back from [place] Aug 8[address]  severe productive cough / assoc with chest congestion and post drainage x 2 weeks > lots of white mucus seemed to improve except for harsh barking quality and present ever since so referred to pulmonary clinic 11/03/2016 by  Carlton Adam NP/ has RA refer to [facility]      History of Present Illness  11/03/2016 1st [place] [facility] office visit/ Denese Mentink   Chief Complaint  Patient presents with  . Pulmonary Consult    Dr. Balinda Quails referred pt, cough since August 2016, ENT said she did not have GERD, tramadol helped with the coughing, cough on and off today, early mornings coughs up clear/yellow mucus  pt unclear on date of onset but records indicate  jan 2017 sought care for cough of duration x  6 months and rx with zyrtec and and something for reflux but no better / cough worse when wakes up each am but sleeps fine p taking tramadol and really no excess or overtly purulent am sputum and cough tends to worsen as day goes on unless takes tramadol with sensation of pnds but no rhinorhea.  Has h/o severe sinus infections ever since adult so went to [facility]  ENT July 2017 > ? Might be reflux  rx tramadol up to twice daily seems to help  rec First take delsym two tsp every 12 hours and supplement if needed with  tramadol 50 mg up to 2 every 4 hours to suppress the urge to cough at all or even clear your throat.    Prednisone 10 mg take  4 each am x 2 days,   2 each am x 2 days,  1 each am x 2 days and stop (this is to eliminate allergies and inflammation from coughing) Protonix (pantoprazole) Take 30-60 min before first meal of the day and Pepcid 20 mg one bedtime plus chlorpheniramine 4 mg x 2 at bedtime  (both available over the counter)  until cough is completely gone for at least a week without the need for cough suppression GERD diet      11/29/2016  f/u ov/Tasmine Hipwell re:  Cough x aug 2016  Chief Complaint  Patient presents with  . Follow-up    Cough had improved on prednisone and then worsened once she finished med. She has noticed that she coughs every time she bends over and the more she talks. Cough is occ prod with minimal clear sputum.    cough resolved 3 d into protocol  but continued the tramadol until it ran out and then cough recurred  Still on ppi qam / h2hs  Cough is worse after supper / not typically waking her/ more related to voice use rec Please see patient coordinator before you leave today  to schedule High resolution CT chest  First take delsym two tsp every 12 hours and supplement if needed with  tramadol 50 mg up to 2 every 4 hours to suppress the urge to cough at all or even clear your throat. Swallowing water or using ice chips/non mint and menthol containing candies (such as lifesavers or sugarless jolly ranchers) are also effective.  You should rest your voice and avoid  activities that you know make you cough. Once you have eliminated the cough for 3 straight days try reducing the tramadol first,  then the delsym as tolerated.   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 Follow-up    PFT's done today. Cough has been worse since developing a cold. She is coughing up minimal clear sputum.       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 Follow-up    Breathing is doing well. She states her cough is much improved,  but not completely resolved. She has noticed that she coughs when she talks excessively, and when she bends over. She also coughs for approx 5 min after she lies down.    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 Follow-up    PFT's done. Pt states having increased cough for the past month. Cough is prod with clear to white sputum.    cough is worse since stopped embrel / despite pred At 10 mg and gabapentin 100 tid / ppi ac and pepcid 20 mg  Back on embrel x 4 weeks and prednisone  X since Feb 2018 Not limited by breathing from desired activities   No noct cough   No obvious day to day or daytime variability or assoc excess/ purulent sputum or mucus plugs or hemoptysis or cp or chest tightness, subjective wheeze or overt sinus or hb symptoms. No unusual exp hx or h/o childhood pna/ asthma or knowledge of premature birth.  Sleeping ok without nocturnal  or early am exacerbation  of respiratory  c/o's or need for noct saba. Also denies any obvious fluctuation of symptoms with weather or environmental changes or other aggravating or alleviating factors except as outlined  above   Current Medications, Allergies, Complete Past Medical History, Past Surgical History, Family History, and Social History were reviewed in [employer] record.  ROS  The following are not active complaints unless bolded sore throat, dysphagia, dental problems, itching, sneezing,  nasal congestion or excess/ purulent secretions, ear ache,  fever, chills, sweats, unintended wt loss, classically pleuritic or exertional cp,  orthopnea pnd or leg swelling, presyncope, palpitations, abdominal pain, anorexia, nausea, vomiting, diarrhea  or change in bowel or bladder habits, change in stools or urine, dysuria,hematuria,  rash, arthralgias, visual complaints, headache, numbness, weakness or ataxia or problems with walking or coordination,  change in mood/affect or memory.               Objective:   Physical Exam  amb bf nad    06/21/2017         157  01/31/2017       159  01/02/2017       155      11/03/16 162 lb 3.2 oz (73.6 kg)  10/24/16 158 lb (71.7 kg)  05/02/16 163 lb (73.9 kg)    Vital signs reviewed  - Note on arrival 02 sats  96% on RA     HEENT: nl dentition,  and oropharynx which is pristine.. Nl external ear canals without cough reflex - mild bilateral non-specific turbinate edema     NECK :  without JVD/Nodes/TM/ nl carotid upstrokes bilaterally   LUNGS: no acc muscle use,  Nl contour chest with minimal  end insp crackles bilaterally with cough on deep breath     CV:  RRR  no s3 or murmur or increase in P2, nad no edema   ABD:  soft and nontender with nl inspiratory excursion in the supine position. No bruits or organomegaly appreciated, bowel sounds nl  MS:  Nl gait/ ext warm without deformities, calf tenderness, cyanosis or clubbing No obvious joint restrictions   SKIN: warm and dry without lesions    NEURO:  alert, approp, nl sensorium with  no motor or cerebellar deficits apparent.      I personally reviewed images and agree with  radiology impression as follows:   HRCT Chest  12/05/16  1. 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 2. Nonspecific mild mediastinal lymphadenopathy, probably reactive. 3. Aortic atherosclerosis. 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 4. Left main and 3 vessel coronary atherosclerosis.     Dg Es 08/10/12 Pos HH s gerd     Assessment & Plan:

## 2017-06-22 NOTE — Assessment & Plan Note (Signed)
Dg Es 08/10/12 Pos HH s gerd  FENO 11/03/2016  =   21 Allergy profile 11/03/2016 >  Eos 0.1 /  IgE  2 rast neg - cyclical cough regimen with 6 days of pred 11/03/2016 > transient improvement but did not stop tramadol once cough resolved then cough relapsed off tramadol and did not resp to pred - gabapentin 100 mg tid 01/02/2017 >>>  Improved 01/31/2017     I favor uacs over asthma here - always a difficult call -  but will see if cough flares off prednisone - the cough on late insp is not typical of either uacs (early insp) or asthma (late exp) and could well be due to ILD which may flare off prednisone making it even more confusing that it already is to sort out the source of her non-specificcomplaints but no change in pulmonary meds needed for now  I had an extended discussion with the patient reviewing all relevant studies completed to date and  lasting 15 to 20 minutes of a 25 minute visit    Each maintenance medication was reviewed in detail including most importantly the difference between maintenance and prns and under what circumstances the prns are to be triggered using an action plan format that is not reflected in the computer generated alphabetically organized AVS.    Please see AVS for specific instructions unique to this visit that I personally wrote and verbalized to the the pt in detail and then reviewed with pt  by my nurse highlighting any  changes in therapy recommended at today's visit to their plan of care.

## 2017-06-22 NOTE — Assessment & Plan Note (Signed)
HRCT chest  12/05/2016 1. 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 2. Nonspecific mild mediastinal lymphadenopathy, probably reactive. 3. Aortic atherosclerosis. 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 4. Left main and 3 vessel coronary atherosclerosis. - PFT's  01/02/2017  FVC  2.52 (92%) s obst p nothing prior to study with DLCO  46/47c  % corrects to 72 % for alv volume   PFT's  06/21/2017  FVC  2.51 (92%)  with DLCO  41/42 % corrects to 63  % for alv volume  - 06/21/2017  Walked RA x 3 laps @ 185 ft each stopped due to  End of study, fast pace, no sob or desat    Reviewed studies and emphasized that control of the RA is the key to preserving lung function > f/u [facility] rheum planned

## 2017-06-26 ENCOUNTER — Other Ambulatory Visit: Payer: Self-pay | Admitting: Family Medicine

## 2017-07-10 ENCOUNTER — Ambulatory Visit: Payer: Medicare Other | Admitting: Family Medicine

## 2017-08-02 ENCOUNTER — Other Ambulatory Visit: Payer: Self-pay | Admitting: Internal Medicine

## 2017-08-02 DIAGNOSIS — J45991 Cough variant asthma: Secondary | ICD-10-CM

## 2017-08-10 ENCOUNTER — Ambulatory Visit (INDEPENDENT_AMBULATORY_CARE_PROVIDER_SITE_OTHER): Payer: Medicare Other | Admitting: Family Medicine

## 2017-08-10 ENCOUNTER — Encounter: Payer: Self-pay | Admitting: Family Medicine

## 2017-08-10 VITALS — BP 152/92 | HR 91 | Temp 98.6°F | Resp 16 | Ht 66.0 in | Wt 164.0 lb

## 2017-08-10 DIAGNOSIS — E785 Hyperlipidemia, unspecified: Secondary | ICD-10-CM | POA: Diagnosis not present

## 2017-08-10 DIAGNOSIS — Z23 Encounter for immunization: Secondary | ICD-10-CM

## 2017-08-10 DIAGNOSIS — R030 Elevated blood-pressure reading, without diagnosis of hypertension: Secondary | ICD-10-CM

## 2017-08-10 DIAGNOSIS — R7303 Prediabetes: Secondary | ICD-10-CM | POA: Diagnosis not present

## 2017-08-10 DIAGNOSIS — K449 Diaphragmatic hernia without obstruction or gangrene: Secondary | ICD-10-CM

## 2017-08-10 DIAGNOSIS — Z131 Encounter for screening for diabetes mellitus: Secondary | ICD-10-CM | POA: Diagnosis not present

## 2017-08-10 DIAGNOSIS — R2231 Localized swelling, mass and lump, right upper limb: Secondary | ICD-10-CM | POA: Diagnosis not present

## 2017-08-10 DIAGNOSIS — I7090 Unspecified atherosclerosis: Secondary | ICD-10-CM | POA: Diagnosis not present

## 2017-08-10 LAB — POCT GLYCOSYLATED HEMOGLOBIN (HGB A1C): Hemoglobin A1C: 6.2

## 2017-08-10 NOTE — Progress Notes (Signed)
Subjective:     Patient is a 66 y.o. female who presents for evaluation of atherosclerotic coronary artery disease. Recent history: patient does not perform home BP monitoring, no TIA's, no chest pain on exertion, no dyspnea on exertion, no swelling of ankles, no orthostatic dizziness or lightheadedness, no palpitations and no intermittent claudication symptoms. Ms. Quackenbush had a chest CT several months ago and was instructed to follow up in primary care for atherosclerosis. She is currently not on ASA or statin therapy.   Ms. Follmer continues to complain of a chronic cough. Cough is worsened by laying down and bending. She was referred to pulmonology and is followed by Dr. Melvyn Novas. It is suspected that cough is related to GERD. She has been taking famotadine and protonix consistently without relief. Symptoms began greater than 1 year ago.  The cough is persistent and is aggravated by reclining position. Patient was diagnosed with a hiatal hernia in 2013 and is concerned that hiatal hernia is causing chronic cough to worsen.   Patient complains of nodule to right index finger. She says that nodule is painless and has been present for 3 weeks. She has a history of rheumatoid arthritis. She is currently treated with Embrel for RA and is under the care of Dr. Irene Pap, rheumatology.  She has not attempted any OTC interventions. She has a remote tobacco history and does not have a history of type 2 diabetes mellitus.   Past Medical History:  Diagnosis Date  . Allergic rhinitis due to pollen   . Carpal tunnel syndrome   . Neuromuscular disorder ([place])   . Pure hypercholesterolemia    Social History   Social History  . Marital status: Single    Spouse name: N/A  . Number of children: N/A  . Years of education: N/A   Occupational History  . Not on file.   Social History Main Topics  . Smoking status: Former Smoker    Types: Cigarettes    Quit date: 06/24/1988  . Smokeless tobacco: Never Used  .  Alcohol use No  . Drug use: No  . Sexual activity: No   Other Topics Concern  . Not on file   Social History Narrative  . No narrative on file   Immunization History  Administered Date(s) Administered  . Influenza,inj,Quad PF,6+ Mos 10/22/2014, 10/27/2015, 08/10/2017  . Influenza-Unspecified 07/28/2016  . Pneumococcal Conjugate-13 11/23/2016  . Pneumococcal Polysaccharide-23 07/17/2014  . Tdap 07/17/2014  . Zoster 10/22/2014  No Known Allergies Family History  Problem Relation Age of Onset  . Cancer Father   . Alzheimer's disease Father   . Rheum arthritis Sister   . Colon cancer Neg Hx     Review of Systems Review of Systems  Constitutional: Negative.   HENT: Negative.   Eyes: Negative.   Respiratory: Negative.   Cardiovascular: Negative.  Negative for chest pain, palpitations and orthopnea.  Gastrointestinal: Negative for abdominal pain, blood in stool, constipation, diarrhea, heartburn, melena, nausea and vomiting.  Genitourinary: Negative.  Negative for dysuria, frequency, hematuria and urgency.  Musculoskeletal: Negative.   Skin:       Nodules to elbows and right index finger  Neurological: Negative.   Endo/Heme/Allergies: Negative.  Negative for environmental allergies and polydipsia. Does not bruise/bleed easily.  Psychiatric/Behavioral: Negative.    Objective:    Physical Exam Physical Exam  Constitutional: She is oriented to person, place, and time and well-developed, well-nourished, and in no distress.  HENT:  Head: Normocephalic and atraumatic.  Right Ear: External  ear normal.  Left Ear: External ear normal.  Nose: Nose normal.  Mouth/Throat: Oropharynx is clear and moist.  Eyes: Pupils are equal, round, and reactive to light. Conjunctivae and EOM are normal.  Neck: Normal range of motion. Neck supple.  Cardiovascular: Normal rate, normal heart sounds and intact distal pulses.   Pulmonary/Chest: Effort normal and breath sounds normal.   Musculoskeletal: Normal range of motion.  Neurological: She is alert and oriented to person, place, and time. She has normal reflexes. Gait normal. GCS score is 15.  Skin: Skin is warm and dry.  Pinpoint , raised, hyperpigmented nodule to right index finger.   Psychiatric: Mood, memory, affect and judgment normal.    Cardiographics ECG: normal sinus rhythm, no blocks or conduction defects, no ischemic changes  Lab Review  Will review labs as results become available   Assessment:     Atherosclerotic coronary artery disease.  Plan:  1. Hyperlipidemia, unspecified hyperlipidemia type The 10-year ASCVD risk score Mikey Bussing DC Jr., et al., 2013) is: 11.6%   Values used to calculate the score:     Age: 38 years     Sex: Female     Is Non-Hispanic African American: Yes     Diabetic: No     Tobacco smoker: No     Systolic Blood Pressure: 458 mmHg     Is BP treated: No     HDL Cholesterol: 53 mg/dL     Total Cholesterol: 182 mg/dL  Will start statin therapy after reviewing lipid panel.  - Lipid Panel  2. Elevated blood pressure reading in office without diagnosis of hypertension - BASIC METABOLIC PANEL WITH GFR  3. Diabetes mellitus screening - Hemoglobin A1c - HgB A1c  4. Hiatal hernia  Reviewed previous DG of esophagus, yielded the following results:  Findings:  A double contrast study was performed.  The mucosa of the esophagus is normal with no evidence of ulceration.  A single contrast study shows the swallowing mechanism to be normal. Minimal tertiary contractions are noted distally.  There is a tiny hiatal hernia present.  No definite reflux could be demonstrated. A barium pill was given at the end of the study which delayed slightly in passing at the level of the hiatal hernia, but did pass intact.  IMPRESSION:  1.  Small hiatal hernia.  No definite reflux. 2.  Mild tertiary contractions distally.   Will repeat DG of esophagus to rule out worsening hiatal  hernia  - DG Esophagus; Future  5. Influenza vaccination given - Flu Vaccine QUAD 6+ mos PF IM (Fluarix Quad PF)  6. Atherosclerosis - EKG 12-Lead  7. Nodule of finger of right hand - Ambulatory referral to Dermatology  8. Prediabetes Hemoglobin a1C is 6.2, which is consistent with prediabetes. Recommend a lowfat, low carbohydrate diet divided over 5-6 small meals, increase water intake to 6-8 glasses, and 150 minutes per week of cardiovascular exercise.   Also, given information for 1800 calorie carbohydrate modified diet.    RTC: 3 months for prediabetes   Donia Pounds  MSN, FNP-C [facility] [address] [place], [place] [address] [phone]

## 2017-08-10 NOTE — Patient Instructions (Addendum)
Will compare EKG to previous.  Will review imaging of esphagous to assess worsening hiatal hernia Atherosclerosis, will review lipid panel. I will likely start statin therapy. 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 Result is 6.2, which is consistent with prediabetes. Recommend a lowfat, low carbohydrate diet divided over 5-6 small meals, increase water intake to 6-8 glasses, and 150 minutes per week of cardiovascular exercise.   Atherosclerosis Atherosclerosis is narrowing and hardening of the blood vessels (arteries). Arteries are tubes that carry blood that contains oxygen from the heart to all parts of the body. Arteries can become narrow or clogged with a buildup of fat, cholesterol, calcium, or other substances (plaque). Plaque decreases the amount of blood that can flow through the artery. Atherosclerosis can affect any artery in the body, including:  Heart arteries (coronary artery disease), which may cause heart attack.  Brain arteries, which may cause stroke.  Leg, arm, and pelvis arteries (peripheral artery disease), which may cause pain and numbness.  Kidney arteries, which may cause kidney (renal) failure.  Treatment may slow the disease and prevent further damage to the heart, brain, peripheral arteries, and kidneys. What are the causes? Atherosclerosis develops when plaque forms in an artery. This damages the inside wall of the artery. Over time, the plaque grows and hardens. It may break through the artery wall. This causes a blood clot to form over the break, which narrows the artery more. The clot may also break loose and travel to other arteries, causing more damage. What increases the risk? This condition is more likely to develop in people who:  Are middle age or older.  Have a family history of  atherosclerosis.  Have high cholesterol.  Have high blood fats (triglycerides).  Have diabetes.  Are overweight.  Smoke tobacco.  Do not exercise enough.  Have a substance in the blood that indicates increased levels of inflammation in the body (C-reactive protein, or CRP).  Have sleep apnea.  Are stressed.  Drink too much alcohol.  What are the signs or symptoms? This condition may not cause any symptoms. If you do have symptoms, they are caused by damage to an area of your body that is not getting enough blood. The following symptoms are possible:  Coronary artery disease may cause chest pain and shortness of breath.  Decreased blood supply to your brain may cause a stroke. Signs and symptoms of stroke may include sudden: ? Weakness on one side of the body. ? Confusion. ? Changes in vision. ? Inability to speak or understand speech. ? Loss of balance, coordination, or ability to walk. ? Severe headache. ? Loss of consciousness.  Peripheral artery disease may cause pain and numbness, often in the legs and hips.  Renal failure may cause fatigue, nausea, swelling, and itchy skin.  How is this diagnosed? This condition is diagnosed based on your medical history and a physical exam. During the exam, your health care provider will check your pulses and listen for a "whooshing" sound over your arteries (bruit). You may have tests, such as:  Blood tests to check your levels of cholesterol, triglycerides, and CRP.  Electrocardiogram (ECG) to check for heart damage.  Chest X-ray to see if your heart is enlarged, which is a sign of heart failure.  Stress test to see how your heart reacts to exercise.  Echocardiogram to get images  of the inside of your heart.  Ankle-Brachial index to compare blood pressure in your arms to blood pressure in your ankles.  Ultrasound of your peripheral arteries to check blood flow.  CT scan to check for damage to your heart or  brain.  X-rays of blood vessels after dye has been injected (angiogram) to check blood flow.  How is this treated? Treatment starts with lifestyle changes, which may include:  Changing your diet.  Losing weight.  Reducing stress.  Exercising and being more physically active.  Not smoking.  You also may need medicine to:  Lower triglycerides and cholesterol.  Lower and control blood pressure.  Prevent blood clots.  Lower inflammation in your body.  Lower and control your blood sugar.  Sometimes, surgery is needed to remove plaque, widen your artery, or create a new path for your blood (bypass). Surgical treatment may include:  Removing plaque from an artery (endarterectomy).  Opening a narrowed heart artery (angioplasty).  Heart or peripheral artery bypass graft surgery.  Follow these instructions at home: Lifestyle   Eat a heart-healthy diet. Talk to your health care provider or a diet specialist (dietitian) if you need help. A heart-healthy diet includes: ? Limiting unhealthy fats and increasing healthy fats. Some examples of healthy fats are olive oil and canola oil. ? Eating plant-based foods, such as fruits, vegetables, nuts, legumes, and whole grains.  Follow an exercise program as told by your health care provider.  Maintain a healthy weight. Lose weight if directed by your health care provider.  Rest when you are tired.  Learn to manage your stress.  Do not use any tobacco products, such as cigarettes, chewing tobacco, and e-cigarettes. If you need help quitting, ask your health care provider.  Limit alcohol intake to no more than 1 drink a day for nonpregnant women and 2 drinks a day for men. One drink equals 12 oz of beer, 5 oz of wine, or 1 oz of hard liquor.  Do not abuse drugs. General instructions  Take over-the-counter and prescription medicines only as told by your health care provider.  Manage other health conditions as told by your  health care provider.  Keep all follow-up visits as told by your health care provider. This is important. Contact a health care provider if:  You have chest pain or discomfort. This includes squeezing chest pain that may feel like indigestion (angina).  You have shortness of breath.  You have an irregular heartbeat.  You have unexplained fatigue.  You have unexplained pain or numbness in an arm, leg, or hip.  You have nausea, swelling of your hands or feet, and itchy skin. Get help right away if:  You have symptoms of a heart attack, such as: ? Chest pain. ? Shortness of breath. ? Pain in your neck, jaw, arms, back, or stomach. ? Cold sweat. ? Nausea. ? Light-headedness.  You have symptoms of a stroke, such as sudden: ? Weakness on one side of your body. ? Confusion. ? Changes in vision. ? Inability to speak or understand speech. ? Loss of balance, coordination, or ability to walk. ? Severe headache. ? Loss of consciousness. These symptoms may represent a serious problem that is an emergency. Do not wait to see if the symptoms will go away. Get medical help right away. Call your local emergency services (911 in the [place] Do not drive yourself to the hospital. This information is not intended to replace advice given to you by your health care provider.  Make sure you discuss any questions you have with your health care provider. Document Released: 01/28/2004 Document Revised: 04/14/2016 Document Reviewed: 09/28/2015 Elsevier Interactive Patient Education  2018 [employer].  Prediabetes Prediabetes is the condition of having a blood sugar (blood glucose) level that is higher than it should be, but not high enough for you to be diagnosed with type 2 diabetes. Having prediabetes puts you at risk for developing type 2 diabetes (type 2 diabetes mellitus). Prediabetes may be called impaired glucose tolerance or impaired fasting glucose. Prediabetes usually does not cause symptoms.  Your health care provider can diagnose this condition with blood tests. You may be tested for prediabetes if you are overweight and if you have at least one other risk factor for prediabetes. Risk factors for prediabetes include:  Having a family member with type 2 diabetes.  Being overweight or obese.  Being older than age 50.  Being of American-Indian, African-American, Hispanic/Latino, or Asian/Pacific Islander descent.  Having an inactive (sedentary) lifestyle.  Having a history of gestational diabetes or polycystic ovarian syndrome (PCOS).  Having low levels of good cholesterol (HDL-C) or high levels of blood fats (triglycerides).  Having high blood pressure.  What is blood glucose and how is blood glucose measured?  Blood glucose refers to the amount of glucose in your bloodstream. Glucose comes from eating foods that contain sugars and starches (carbohydrates) that the body breaks down into glucose. Your blood glucose level may be measured in mg/dL (milligrams per deciliter) or mmol/L (millimoles per liter).Your blood glucose may be checked with one or more of the following blood tests:  A fasting blood glucose (FBG) test. You will not be allowed to eat (you will fast) for at least 8 hours before a blood sample is taken. ? A normal range for FBG is 70-100 mg/dl (3.9-5.6 mmol/L).  An A1c (hemoglobin A1c) blood test. This test provides information about blood glucose control over the previous 2?72months.  An oral glucose tolerance test (OGTT). This test measures your blood glucose twice: ? After fasting. This is your baseline level. ? Two hours after you drink a beverage that contains glucose.  You may be diagnosed with prediabetes:  If your FBG is 100?125 mg/dL (5.6-6.9 mmol/L).  If your A1c level is 5.7?6.4%.  If your OGGT result is 140?199 mg/dL (7.8-11 mmol/L).  These blood tests may be repeated to confirm your diagnosis. What happens if blood glucose is too  high? The pancreas produces a hormone (insulin) that helps move glucose from the bloodstream into cells. When cells in the body do not respond properly to insulin that the body makes (insulin resistance), excess glucose builds up in the blood instead of going into cells. As a result, high blood glucose (hyperglycemia) can develop, which can cause many complications. This is a symptom of prediabetes. What can happen if blood glucose stays higher than normal for a long time? Having high blood glucose for a long time is dangerous. Too much glucose in your blood can damage your nerves and blood vessels. Long-term damage can lead to complications from diabetes, which may include:  Heart disease.  Stroke.  Blindness.  Kidney disease.  Depression.  Poor circulation in the feet and legs, which could lead to surgical removal (amputation) in severe cases.  How can prediabetes be prevented from turning into type 2 diabetes?  To help prevent type 2 diabetes, take the following actions:  Be physically active. ? Do moderate-intensity physical activity for at least 30 minutes on at  least 5 days of the week, or as much as told by your health care provider. This could be brisk walking, biking, or water aerobics. ? Ask your health care provider what activities are safe for you. A mix of physical activities may be best, such as walking, swimming, cycling, and strength training.  Lose weight as told by your health care provider. ? Losing 5-7% of your body weight can reverse insulin resistance. ? Your health care provider can determine how much weight loss is best for you and can help you lose weight safely.  Follow a healthy meal plan. This includes eating lean proteins, complex carbohydrates, fresh fruits and vegetables, low-fat dairy products, and healthy fats. ? Follow instructions from your health care provider about eating or drinking restrictions. ? Make an appointment to see a diet and nutrition  specialist (registered dietitian) to help you create a healthy eating plan that is right for you.  Do not smoke or use any tobacco products, such as cigarettes, chewing tobacco, and e-cigarettes. If you need help quitting, ask your health care provider.  Take over-the-counter and prescription medicines as told by your health care provider. You may be prescribed medicines that help lower the risk of type 2 diabetes.  This information is not intended to replace advice given to you by your health care provider. Make sure you discuss any questions you have with your health care provider. Document Released: 02/29/2016 Document Revised: 04/14/2016 Document Reviewed: 12/29/2015 Elsevier Interactive Patient Education  [employer].

## 2017-08-11 LAB — BASIC METABOLIC PANEL WITH GFR
BUN: 16 mg/dL (ref 7–25)
CO2: 24 mmol/L (ref 20–32)
Calcium: 9.5 mg/dL (ref 8.6–10.4)
Chloride: 101 mmol/L (ref 98–110)
Creat: 0.93 mg/dL (ref 0.50–0.99)
GFR, EST NON AFRICAN AMERICAN: 64 mL/min/{1.73_m2} (ref >=60)
GFR, Est African American: 74 mL/min/{1.73_m2} (ref >=60)
Glucose, Bld: 101 mg/dL — ABNORMAL HIGH (ref 65–99)
POTASSIUM: 4.1 mmol/L (ref 3.5–5.3)
Sodium: 140 mmol/L (ref 135–146)

## 2017-08-11 LAB — LIPID PANEL
CHOL/HDL RATIO: 2.6 (calc) (ref <5.0)
CHOLESTEROL: 226 mg/dL — AB (ref <200)
HDL: 88 mg/dL (ref >50)
LDL CHOLESTEROL (CALC): 110 mg/dL — AB
Non-HDL Cholesterol (Calc): 138 mg/dL (calc) — ABNORMAL HIGH (ref <130)
Triglycerides: 168 mg/dL — ABNORMAL HIGH (ref <150)

## 2017-08-13 ENCOUNTER — Other Ambulatory Visit: Payer: Self-pay | Admitting: Family Medicine

## 2017-08-13 DIAGNOSIS — I7090 Unspecified atherosclerosis: Secondary | ICD-10-CM

## 2017-08-13 DIAGNOSIS — E785 Hyperlipidemia, unspecified: Secondary | ICD-10-CM

## 2017-08-13 MED ORDER — ATORVASTATIN CALCIUM 20 MG PO TABS: 20.0000 mg | ORAL_TABLET | Freq: Every day | ORAL | 3 refills | Status: DC

## 2017-08-13 NOTE — Progress Notes (Signed)
Lorraine Blair, a 66 year old female with a history of aortic athlerosclerosis presented on 9/19 for a routine follow up. Reviewed lipid panel. Total cholesterol elevated at 226 and LDL elevated at 110. Will start statin therapy.   Meds ordered this encounter  Medications  . atorvastatin (LIPITOR) 20 MG tablet    Sig: Take 1 tablet (20 mg total) by mouth daily.    Dispense:  90 tablet    Refill:  [place]  MSN, FNP-C [facility] [address] [place], [place] [address] [phone]

## 2017-08-14 ENCOUNTER — Telehealth: Payer: Self-pay

## 2017-08-14 ENCOUNTER — Ambulatory Visit (HOSPITAL_COMMUNITY)
Admission: RE | Admit: 2017-08-14 | Discharge: 2017-08-14 | Disposition: A | Discharge status: Home / Self Care | Payer: Medicare Other | Source: Ambulatory Visit | Attending: Family Medicine | Admitting: Family Medicine

## 2017-08-14 ENCOUNTER — Other Ambulatory Visit: Payer: Self-pay | Admitting: Family Medicine

## 2017-08-14 DIAGNOSIS — K449 Diaphragmatic hernia without obstruction or gangrene: Secondary | ICD-10-CM

## 2017-08-14 DIAGNOSIS — K219 Gastro-esophageal reflux disease without esophagitis: Secondary | ICD-10-CM | POA: Insufficient documentation

## 2017-08-14 NOTE — Telephone Encounter (Signed)
-----   Message from Dorena Dew, [place] sent at 08/13/2017 10:23 PM EDT ----- Regarding: lab results Please inform Ms. Montord that cholesterol is elevated at 226, goal is <200 and LDL (bad cholesterol) is elevated at 110, goal is < 100. Will repeat in 3 months.    Thanks

## 2017-08-14 NOTE — Telephone Encounter (Signed)
Called, no answer Left message to call back. Also need to advise patient that lipitor has been sent into pharmacy. Thanks!

## 2017-08-15 ENCOUNTER — Telehealth: Payer: Self-pay

## 2017-08-15 NOTE — Telephone Encounter (Signed)
Called and spoke with patient. Advised that she can discontinue the Prilosec and only take the pepcid as needed. Thanks!

## 2017-08-15 NOTE — Telephone Encounter (Signed)
-----   Message from Dorena Dew, [place] sent at 08/14/2017  4:37 PM EDT ----- Regarding: RE: lab results Please inform Ms. Estell that she does not need both medications. Will discontinue pantoprazole. She can take pepcid 20 mg as needed.   Thanks ----- Message ----- From: Adelina Mings, LPN Sent: [id number]/81/5947   3:56 PM To: Dorena Dew, FNP Subject: RE: lab results                                Patient is asking if she should still continue to take the Pepcid and Prilosec since this was why she was taking it. Please advise. Thanks!  ----- Message ----- From: Dorena Dew, FNP Sent: 08/14/2017   2:25 PM To: Adelina Mings, LPN Subject: lab results                                    Please inform Ms. Colgan that there is no visible hiatal hernia. Barium swallow was unremarkable.   Thanks

## 2017-11-09 ENCOUNTER — Encounter: Payer: Self-pay | Admitting: Family Medicine

## 2017-11-09 ENCOUNTER — Ambulatory Visit: Payer: Medicare Other | Admitting: Family Medicine

## 2017-11-09 VITALS — BP 127/79 | HR 99 | Temp 99.3°F | Resp 16 | Ht 66.0 in | Wt 156.0 lb

## 2017-11-09 DIAGNOSIS — M069 Rheumatoid arthritis, unspecified: Secondary | ICD-10-CM | POA: Diagnosis not present

## 2017-11-09 DIAGNOSIS — R7303 Prediabetes: Secondary | ICD-10-CM

## 2017-11-09 DIAGNOSIS — E785 Hyperlipidemia, unspecified: Secondary | ICD-10-CM

## 2017-11-09 LAB — POCT GLYCOSYLATED HEMOGLOBIN (HGB A1C): HEMOGLOBIN A1C: 6.1

## 2017-11-09 NOTE — Patient Instructions (Addendum)
Food Choices to Lower Your Triglycerides Triglycerides are a type of fat in your blood. High levels of triglycerides can increase the risk of heart disease and stroke. If your triglyceride levels are high, the foods you eat and your eating habits are very important. Choosing the right foods can help lower your triglycerides. What general guidelines do I need to follow?  Lose weight if you are overweight.  Limit or avoid alcohol.  Fill one half of your plate with vegetables and green salads.  Limit fruit to two servings a day. Choose fruit instead of juice.  Make one fourth of your plate whole grains. Look for the word "whole" as the first word in the ingredient list.  Fill one fourth of your plate with lean protein foods.  Enjoy fatty fish (such as salmon, mackerel, sardines, and tuna) three times a week.  Choose healthy fats.  Limit foods high in starch and sugar.  Eat more home-cooked food and less restaurant, buffet, and fast food.  Limit fried foods.  Cook foods using methods other than frying.  Limit saturated fats.  Check ingredient lists to avoid foods with partially hydrogenated oils (trans fats) in them. What foods can I eat? Grains Whole grains, such as whole wheat or whole grain breads, crackers, cereals, and pasta. Unsweetened oatmeal, bulgur, barley, quinoa, or brown rice. Corn or whole wheat flour tortillas. Vegetables Fresh or frozen vegetables (raw, steamed, roasted, or grilled). Green salads. Fruits All fresh, canned (in natural juice), or frozen fruits. Meat and Other Protein Products Ground beef (85% or leaner), grass-fed beef, or beef trimmed of fat. Skinless chicken or [place]. Ground chicken or [place]. Pork trimmed of fat. All fish and seafood. Eggs. Dried beans, peas, or lentils. Unsalted nuts or seeds. Unsalted canned or dry beans. Dairy Low-fat dairy products, such as skim or 1% milk, 2% or reduced-fat cheeses, low-fat ricotta or cottage cheese, or  plain low-fat yogurt. Fats and Oils Tub margarines without trans fats. Light or reduced-fat mayonnaise and salad dressings. Avocado. Safflower, olive, or canola oils. Natural peanut or almond butter. The items listed above may not be a complete list of recommended foods or beverages. Contact your dietitian for more options. What foods are not recommended? Grains White bread. White pasta. White rice. Cornbread. Bagels, pastries, and croissants. Crackers that contain trans fat. Vegetables White potatoes. Corn. Creamed or fried vegetables. Vegetables in a cheese sauce. Fruits Dried fruits. Canned fruit in light or heavy syrup. Fruit juice. Meat and Other Protein Products Fatty cuts of meat. Ribs, chicken wings, bacon, sausage, bologna, salami, chitterlings, fatback, hot dogs, bratwurst, and packaged luncheon meats. Dairy Whole or 2% milk, cream, half-and-half, and cream cheese. Whole-fat or sweetened yogurt. Full-fat cheeses. Nondairy creamers and whipped toppings. Processed cheese, cheese spreads, or cheese curds. Sweets and Desserts Corn syrup, sugars, honey, and molasses. Candy. Jam and jelly. Syrup. Sweetened cereals. Cookies, pies, cakes, donuts, muffins, and ice cream. Fats and Oils Butter, stick margarine, lard, shortening, ghee, or bacon fat. Coconut, palm kernel, or palm oils. Beverages Alcohol. Sweetened drinks (such as sodas, lemonade, and fruit drinks or punches). The items listed above may not be a complete list of foods and beverages to avoid. Contact your dietitian for more information. This information is not intended to replace advice given to you by your health care provider. Make sure you discuss any questions you have with your health care provider. Document Released: 08/25/2004 Document Revised: 04/14/2016 Document Reviewed: 09/11/2013 Elsevier Interactive Patient Education  2017 [employer].  Rheumatoid Arthritis Rheumatoid arthritis (RA) is a long-term (chronic)  disease. RA causes inflammation in your joints. Your joints may feel painful, stiff, swollen, warm, or tender. RA may start slowly. Usually, it affects the small joints of the hands and feet. It can also affect other parts of the body, even the heart, eyes, or lungs. Symptoms of RA often come and go. Sometimes, symptoms get worse for a while. These are called flares. There is no cure for RA, but your doctor will work with you to find the best treatment option for you. This will depend on how the disease is changing in your body. Follow these instructions at home:  Take over-the-counter and prescription medicines only as told by your doctor. Your doctor may change (adjust) your medicines every 3 months.  Start an exercise program as told by your doctor.  Rest when you have a flare.  Return to your normal activities as told by your doctor. Ask your doctor what activities are safe for you.  Keep all follow-up visits as told by your doctor. This is important. Contact a doctor if:  You have a flare.  You have a fever.  You have problems (side effects) because of your medicines. Get help right away if:  You have chest pain.  You have trouble breathing.  You have a hot, painful joint all of a sudden, and it is worse than your usual joint aches. This information is not intended to replace advice given to you by your health care provider. Make sure you discuss any questions you have with your health care provider. Document Released: 01/30/2012 Document Revised: 04/14/2016 Document Reviewed: 08/20/2015 Elsevier Interactive Patient Education  2018 [employer].  Cholesterol Cholesterol is a fat. Your body needs a small amount of cholesterol. Cholesterol (plaque) may build up in your blood vessels (arteries). That makes you more likely to have a heart attack or stroke. You cannot feel your cholesterol level. Having a blood test is the only way to find out if your level is high. Keep your test  results. Work with your doctor to keep your cholesterol at a good level. What do the results mean?  Total cholesterol is how much cholesterol is in your blood.  LDL is bad cholesterol. This is the type that can build up. Try to have low LDL.  HDL is good cholesterol. It cleans your blood vessels and carries LDL away. Try to have high HDL.  Triglycerides are fat that the body can store or burn for energy. What are good levels of cholesterol?  Total cholesterol below 200.  LDL below 100 is good for people who have health risks. LDL below 70 is good for people who have very high risks.  HDL above 40 is good. It is best to have HDL of 60 or higher.  Triglycerides below 150. How can I lower my cholesterol? Diet Follow your diet program as told by your doctor.  Choose fish, white meat chicken, or [place] that is roasted or baked. Try not to eat red meat, fried foods, sausage, or lunch meats.  Eat lots of fresh fruits and vegetables.  Choose whole grains, beans, pasta, potatoes, and cereals.  Choose olive oil, corn oil, or canola oil. Only use small amounts.  Try not to eat butter, mayonnaise, shortening, or palm kernel oils.  Try not to eat foods with trans fats.  Choose low-fat or nonfat dairy foods. ? Drink skim or nonfat milk. ? Eat low-fat or nonfat yogurt and cheeses. ?  Try not to drink whole milk or cream. ? Try not to eat ice cream, egg yolks, or full-fat cheeses.  Healthy desserts include angel food cake, ginger snaps, animal crackers, hard candy, popsicles, and low-fat or nonfat frozen yogurt. Try not to eat pastries, cakes, pies, and cookies.  Exercise Follow your exercise program as told by your doctor.  Be more active. Try gardening, walking, and taking the stairs.  Ask your doctor about ways that you can be more active.  Medicine  Take over-the-counter and prescription medicines only as told by your doctor. This information is not intended to replace advice  given to you by your health care provider. Make sure you discuss any questions you have with your health care provider. Document Released: 02/03/2009 Document Revised: 06/08/2016 Document Reviewed: 05/19/2016 Elsevier Interactive Patient Education  [employer].

## 2017-11-09 NOTE — Progress Notes (Signed)
Subjective:    Chief Complaint  Patient presents with  . Follow-up    prediaetes     Patient is a 66 y.o. female who presents for evaluation of atherosclerotic coronary artery disease and prediabetes. Recent history: patient does not perform home BP monitoring, no TIA's, no chest pain on exertion, no dyspnea on exertion, no swelling of ankles, no orthostatic dizziness or lightheadedness, no palpitations and no intermittent claudication symptoms. Ms. Brester had a chest CT several months ago and was instructed to follow up in primary care for atherosclerosis. She is currently not on ASA or statin therapy.  Patient states that she took statin therapy for 30 days and decided to discontinue when starting an all natural diet.  Patient has a history of severe stage III rheumatoid arthritis with positive rheumatoid factor.  Patient experiences chronic pain and swelling in bilateral upper extremities mostly in the morning she describes pain as constant and throbbing.  Patient was previously treated with leflunomide, prednisone, and Enbrel.  She is discontinued all medications due to unwanted side effects.  She states that she started a all-natural diet 5 weeks ago and that symptoms are gradually improving.  Patient has not followed up with rheumatologist since discontinuing medications.  Ms. Kasparek continues to complain of a chronic cough. Cough is worsened by laying down and bending. She was referred to pulmonology and is followed by Dr. Melvyn Novas. It is suspected that cough is related to GERD. She has been taking famotadine and protonix consistently without relief. Symptoms began greater than 1 year ago.  The cough is persistent and is aggravated by reclining position. Patient was diagnosed with a hiatal hernia in 2013 and is concerned that hiatal hernia is causing chronic cough to worsen.   Past Medical History:  Diagnosis Date  . Allergic rhinitis due to pollen   . Carpal tunnel syndrome   .  Neuromuscular disorder ([place])   . Pure hypercholesterolemia    Social History   Socioeconomic History  . Marital status: Single    Spouse name: Not on file  . Number of children: Not on file  . Years of education: Not on file  . Highest education level: Not on file  Social Needs  . Financial resource strain: Not on file  . Food insecurity - worry: Not on file  . Food insecurity - inability: Not on file  . Transportation needs - medical: Not on file  . Transportation needs - non-medical: Not on file  Occupational History  . Not on file  Tobacco Use  . Smoking status: Former Smoker    Types: Cigarettes    Last attempt to quit: 06/24/1988    Years since quitting: 29.3  . Smokeless tobacco: Never Used  Substance and Sexual Activity  . Alcohol use: No    Alcohol/week: 0.6 oz    Types: 1 Glasses of wine per week  . Drug use: No  . Sexual activity: No  Other Topics Concern  . Not on file  Social History Narrative  . Not on file   Immunization History  Administered Date(s) Administered  . Influenza,inj,Quad PF,6+ Mos 10/22/2014, 10/27/2015, 08/10/2017  . Influenza-Unspecified 07/28/2016  . Pneumococcal Conjugate-13 11/23/2016  . Pneumococcal Polysaccharide-23 07/17/2014  . Tdap 07/17/2014  . Zoster 10/22/2014  No Known Allergies Family History  Problem Relation Age of Onset  . Cancer Father   . Alzheimer's disease Father   . Rheum arthritis Sister   . Colon cancer Neg Hx     Review of  Systems Review of Systems  Constitutional: Negative.   HENT: Negative.   Eyes: Negative.   Respiratory: Negative.   Cardiovascular: Negative.  Negative for chest pain, palpitations and orthopnea.  Gastrointestinal: Negative for abdominal pain, blood in stool, constipation, diarrhea, heartburn, melena, nausea and vomiting.  Genitourinary: Negative.  Negative for dysuria, frequency, hematuria and urgency.  Musculoskeletal: Negative.   Skin:       Nodules to elbows and right index  finger  Neurological: Negative.   Endo/Heme/Allergies: Negative.  Negative for environmental allergies and polydipsia. Does not bruise/bleed easily.  Psychiatric/Behavioral: Negative.    Objective:    Physical Exam Physical Exam  Constitutional: She is oriented to person, place, and time and well-developed, well-nourished, and in no distress.  HENT:  Head: Normocephalic and atraumatic.  Right Ear: External ear normal.  Left Ear: External ear normal.  Nose: Nose normal.  Mouth/Throat: Oropharynx is clear and moist.  Eyes: Conjunctivae and EOM are normal. Pupils are equal, round, and reactive to light.  Neck: Normal range of motion. Neck supple.  Cardiovascular: Normal rate, normal heart sounds and intact distal pulses.  Pulmonary/Chest: Effort normal and breath sounds normal.  Musculoskeletal: Normal range of motion.  Neurological: She is alert and oriented to person, place, and time. She has normal reflexes. Gait normal. GCS score is 15.  Skin: Skin is warm and dry.  Pinpoint , raised, hyperpigmented nodule to right index finger.   Psychiatric: Mood, memory, affect and judgment normal.    Cardiographics ECG: normal sinus rhythm, no blocks or conduction defects, no ischemic changes  Lab Review  Will review labs as results become available   Assessment:     Atherosclerotic coronary artery disease.  Plan:  Prediabetes Hemoglobin A1c is 6.1, which is consistent with prediabetes. The patient is asked to make an attempt to improve diet and exercise patterns to aid in medical management of this problem.  - HgB A1c  Hyperlipidemia LDL goal <100 The 10-year ASCVD risk score Mikey Bussing DC Jr., et al., 2013) is: 8.7%   Values used to calculate the score:     Age: 53 years     Sex: Female     Is Non-Hispanic African American: Yes     Diabetic: No     Tobacco smoker: No     Systolic Blood Pressure: 222 mmHg     Is BP treated: No     HDL Cholesterol: 88 mg/dL     Total Cholesterol:  226 mg/dL - Lipid Panel; Future - CMP and Liver; Future   Rheumatoid arthritis involving multiple sites, unspecified rheumatoid factor presence ([place]) Recommend that patient follows up with Dr. Gilda Crease as previously scheduled to discuss changes in medication regimen. - CBC; Future - CMP and Liver; Future   RTC, one week for fasting labs in 3 months for chronic conditions    [place]  MSN, FNP-C [facility] [address] [place], [place] [address] [phone]

## 2017-11-16 ENCOUNTER — Other Ambulatory Visit: Payer: Medicare Other

## 2017-11-16 DIAGNOSIS — E785 Hyperlipidemia, unspecified: Secondary | ICD-10-CM

## 2017-11-16 DIAGNOSIS — M069 Rheumatoid arthritis, unspecified: Secondary | ICD-10-CM

## 2017-11-17 ENCOUNTER — Telehealth: Payer: Self-pay

## 2017-11-17 LAB — LIPID PANEL
Chol/HDL Ratio: 3 ratio (ref 0.0–4.4)
Cholesterol, Total: 163 mg/dL (ref 100–199)
HDL: 54 mg/dL (ref >39)
LDL CALC: 88 mg/dL (ref 0–99)
TRIGLYCERIDES: 103 mg/dL (ref 0–149)
VLDL CHOLESTEROL CAL: 21 mg/dL (ref 5–40)

## 2017-11-17 LAB — CMP AND LIVER
ALBUMIN: 3.8 g/dL (ref 3.6–4.8)
ALK PHOS: 61 IU/L (ref 39–117)
ALT: 15 IU/L (ref 0–32)
AST: 24 IU/L (ref 0–40)
BUN: 9 mg/dL (ref 8–27)
Bilirubin Total: 0.3 mg/dL (ref 0.0–1.2)
Bilirubin, Direct: 0.1 mg/dL (ref 0.00–0.40)
CALCIUM: 9.5 mg/dL (ref 8.7–10.3)
CO2: 23 mmol/L (ref 20–29)
CREATININE: 0.87 mg/dL (ref 0.57–1.00)
Chloride: 102 mmol/L (ref 96–106)
GFR calc non Af Amer: 70 mL/min/{1.73_m2} (ref >59)
GFR, EST AFRICAN AMERICAN: 80 mL/min/{1.73_m2} (ref >59)
Glucose: 96 mg/dL (ref 65–99)
POTASSIUM: 4.3 mmol/L (ref 3.5–5.2)
SODIUM: 144 mmol/L (ref 134–144)
TOTAL PROTEIN: 8.1 g/dL (ref 6.0–8.5)

## 2017-11-17 LAB — CBC
HEMATOCRIT: 36.8 % (ref 34.0–46.6)
Hemoglobin: 12.2 g/dL (ref 11.1–15.9)
MCH: 29 pg (ref 26.6–33.0)
MCHC: 33.2 g/dL (ref 31.5–35.7)
MCV: 88 fL (ref 79–97)
PLATELETS: 395 10*3/uL — AB (ref 150–379)
RBC: 4.2 x10E6/uL (ref 3.77–5.28)
RDW: 14.6 % (ref 12.3–15.4)
WBC: 7.5 10*3/uL (ref 3.4–10.8)

## 2017-11-17 NOTE — Telephone Encounter (Signed)
Called, no answer. Left a message advising of normal labs and to keep taking medications as prescribed. Asked that patient keep next scheduled follow up and call if any questions. Thanks!

## 2017-11-17 NOTE — Telephone Encounter (Signed)
-----   Message from Dorena Dew, [place] sent at 11/17/2017  6:10 AM EST ----- Regarding: lab results Please inform patient that all labs are within normal limits. Cholesterol levels have normalized with diet and exercise. No medication changes warranted. Please follow up as scheduled.   Thanks

## 2017-12-22 ENCOUNTER — Ambulatory Visit (INDEPENDENT_AMBULATORY_CARE_PROVIDER_SITE_OTHER): Payer: Medicare Other | Admitting: *Deleted

## 2017-12-22 ENCOUNTER — Encounter: Payer: Self-pay | Admitting: Internal Medicine

## 2017-12-22 ENCOUNTER — Ambulatory Visit: Payer: Medicare Other | Admitting: Internal Medicine

## 2017-12-22 VITALS — BP 108/70 | HR 89 | Ht 66.0 in | Wt 144.0 lb

## 2017-12-22 DIAGNOSIS — J8410 Pulmonary fibrosis, unspecified: Secondary | ICD-10-CM

## 2017-12-22 DIAGNOSIS — J45991 Cough variant asthma: Secondary | ICD-10-CM

## 2017-12-22 NOTE — Assessment & Plan Note (Signed)
FENO 11/03/2016  =   21 Allergy profile 11/03/2016 >  Eos 0.1 /  IgE  2 rast neg - cyclical cough regimen with 6 days of pred 11/03/2016 > transient improvement but did not stop tramadol once cough resolved then cough relapsed off tramadol and did not resp to pred - gabapentin 100 mg tid 01/02/2017 >>>  Improved 01/31/2017 > pt d/c'd on her own by f/u ov 12/22/2017  DgEs  08/14/17 Fluoroscopic evaluation of swallowing demonstrates normal esophageal motility. No fixed stricture, fold thickening or mass. No reflux with the water siphon maneuver. No visible hiatal hernia. The patient swallowed a 13 mm barium tablet which freely passed into the stomach.    Reviewed most recent dges - no HH after all and no active HB - based on assoc with PF important she at least observe the diet and f/u as planned with q 6 m f/u - if cough flares in interim would not hesitate to rec restart ppi bid ac    see avs for instructions unique to this ov

## 2017-12-22 NOTE — Patient Instructions (Signed)
No change in medications needed  Pulse oximetery while exercising is the best way to monitor your condition   Please schedule a follow up visit in 3 months but call sooner if needed with pfts on return

## 2017-12-22 NOTE — Progress Notes (Signed)
Subjective:    Patient ID: Lorraine Blair, female    DOB: [birth year]    MRN: [id number]    Brief patient profile:  38 yobf  Quit smoking in 1989  With intermittent sinus infections maybe twice a year and gradually improving  until the day after arrived back from [place] Aug 8841  severe productive cough / assoc with chest congestion and post drainage x 2 weeks > lots of white mucus seemed to improve except for harsh barking quality and present ever since so referred to pulmonary clinic 11/03/2016 by  Carlton Adam NP/ has RA refer to [facility]      History of Present Illness  11/03/2016 1st [facility] office visit/ Shulamis Wenberg   Chief Complaint  Patient presents with  . Pulmonary Consult    Dr. Balinda Quails referred pt, cough since August 2016, ENT said she did not have GERD, tramadol helped with the coughing, cough on and off today, early mornings coughs up clear/yellow mucus  pt unclear on date of onset but records indicate  jan 2017 sought care for cough of duration x  6 months and rx with zyrtec and and something for reflux but no better / cough worse when wakes up each am but sleeps fine p taking tramadol and really no excess or overtly purulent am sputum and cough tends to worsen as day goes on unless takes tramadol with sensation of pnds but no rhinorhea.  Has h/o severe sinus infections ever since adult so went to [facility]  ENT July 2017 > ? Might be reflux  rx tramadol up to twice daily seems to help  rec First take delsym two tsp every 12 hours and supplement if needed with  tramadol 50 mg up to 2 every 4 hours to suppress the urge to cough at all or even clear your throat.    Prednisone 10 mg take  4 each am x 2 days,   2 each am x 2 days,  1 each am x 2 days and stop (this is to eliminate allergies and inflammation from coughing) Protonix (pantoprazole) Take 30-60 min before first meal of the day and Pepcid 20 mg one bedtime plus chlorpheniramine 4 mg x 2 at bedtime  (both available over the counter)  until cough is completely gone for at least a week without the need for cough suppression GERD diet      11/29/2016  f/u ov/Jaxon Mynhier re:  Cough x aug 2016  Chief Complaint  Patient presents with  . Follow-up    Cough had improved on prednisone and then worsened once she finished med. She has noticed that she coughs every time she bends over and the more she talks. Cough is occ prod with minimal clear sputum.    cough resolved 3 d into protocol  but continued the tramadol until it ran out and then cough recurred  Still on ppi qam / h2hs  Cough is worse after supper / not typically waking her/ more related to voice use rec Please see patient coordinator before you leave today  to schedule High resolution CT chest  First take delsym two tsp every 12 hours and supplement if needed with  tramadol 50 mg up to 2 every 4 hours to suppress the urge to cough at all or even clear your throat. Swallowing water or using ice chips/non mint and menthol containing candies (such as lifesavers or sugarless jolly ranchers) are also effective.  You should rest your voice and avoid  activities that you know make you cough. Once you have eliminated the cough for 3 straight days try reducing the tramadol first,  then the delsym as tolerated.   Prednisone 10 mg take  4 each am x 2 days,   2 each am x 2 days,  1 each am x 2 days and stop (this is to eliminate allergies and inflammation from coughing) Protonix (pantoprazole) Take 30-60 min before first meal of the day and Pepcid 20 mg one bedtime plus chlorpheniramine 4 mg x 2 at bedtime (both available over the counter)  until cough is completely gone for at least a week without the need for cough suppression GERD diet      01/02/2017  f/u ov/Morganne Haile re: uacs/ pf in pt with RA  Chief Complaint  Patient presents with  . Follow-up    PFT's done today. Cough has been worse since developing a cold. She is coughing up minimal clear sputum.       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 Follow-up    PFT's done. Pt states having increased cough for the past month. Cough is prod with clear to white sputum.    cough is worse since stopped embrel / despite pred At 10 mg and gabapentin 100 tid / ppi ac and pepcid 20 mg  Back on embrel x 4 weeks and prednisone  X since Feb 2018 Not limited by breathing from desired activities   No noct cough  rec Increase gabapentin 300mg  three times  Daily  For cough > tessalon 200 mg three times daily as needed but if the gabapentin works you should not need the [place]      12/22/2017  f/u ov/Kelsay Haggard re:  ILD/RA on pred 10 mg daily and embrel/ no longer on gerd rx or gabapentin - took herself off  Chief Complaint  Patient presents with  . Follow-up    6MW done. Breathing is unchanged. She is not coughing much. No new co's.   Dyspnea:  Not limited by breathing from desired activities   Cough: avg twice daily severe cough > min mucus esp before lie down hs / sporadic during the day  Sleep: fine flat/ no cough / no need for inhalers   No obvious day to day or daytime variability or assoc excess/ purulent sputum or mucus plugs or hemoptysis or cp or chest tightness, subjective wheeze or overt sinus or hb symptoms. No unusual exposure hx or h/o childhood pna/ asthma or knowledge of premature birth.  Sleeping ok flat without nocturnal  or early am exacerbation  of respiratory  c/o's or need for noct saba. Also denies any obvious fluctuation of  symptoms with weather or environmental changes or other aggravating or alleviating factors except as outlined above   Current Allergies, Complete Past Medical History, Past Surgical History, Family History, and Social History were reviewed in [employer] record.  ROS  The following  are not active complaints unless bolded Hoarseness, sore throat, dysphagia, dental problems, itching, sneezing,  nasal congestion or discharge of excess mucus or purulent secretions, ear ache,   fever, chills, sweats, unintended wt loss or wt gain, classically pleuritic or exertional cp,  orthopnea pnd or leg swelling, presyncope, palpitations, abdominal pain, anorexia, nausea, vomiting, diarrhea  or change in bowel habits or change in bladder habits, change in stools or change in urine, dysuria, hematuria,  rash, arthralgias, visual complaints, headache, numbness, weakness or ataxia or problems with walking or coordination,  change in mood/affect or memory.        Current Meds  Medication Sig  . cholecalciferol (VITAMIN D) 1000 units tablet Take 1,000 Units by mouth daily.  . Cyanocobalamin (B-12 PO) Take 1 tablet by mouth daily.  . Etanercept (ENBREL [place]) Inject into the skin as directed.  . predniSONE (DELTASONE) 10 MG tablet Take 1 tablet by mouth daily.  . TURMERIC PO Take 1 capsule by mouth daily.                Objective:   Physical Exam   amb bf nad   12/22/2017         144  06/21/2017         157  01/31/2017       159  01/02/2017       155      11/03/16 162 lb 3.2 oz (73.6 kg)  10/24/16 158 lb (71.7 kg)  05/02/16 163 lb (73.9 kg)    Vital signs reviewed - Note on arrival 02 sats  98% on RA       HEENT: nl dentition, turbinates bilaterally, and oropharynx. Nl external ear canals without cough reflex   NECK :  without JVD/Nodes/TM/ nl carotid upstrokes bilaterally   LUNGS: no acc muscle use,  Nl contour chest with minimal bilateral end insp crackles s cough  induced   CV:  RRR  no s3 or murmur or increase in P2, and no edema   ABD:  soft and nontender with nl inspiratory excursion in the supine position. No bruits or organomegaly appreciated, bowel sounds nl  MS:  Nl gait/ ext warm without deformities, calf tenderness, cyanosis or clubbing No obvious joint restrictions   SKIN: warm and dry without lesions    NEURO:  alert, approp, nl sensorium with  no motor or cerebellar deficits apparent.            DgEs  08/14/17 Fluoroscopic evaluation of swallowing demonstrates normal esophageal motility. No fixed stricture, fold thickening or mass. No reflux with the water siphon maneuver. No visible hiatal hernia. The patient swallowed a 13 mm barium tablet which freely passed into the stomach.      Assessment & Plan:

## 2017-12-22 NOTE — Assessment & Plan Note (Addendum)
HRCT chest  12/05/2016 1. 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 2. Nonspecific mild mediastinal lymphadenopathy, probably reactive. 3. Aortic atherosclerosis. 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 4. Left main and 3 vessel coronary atherosclerosis. - PFT's  01/02/2017  FVC  2.52 (92%) s obst p nothing prior to study with DLCO  46/47c  % corrects to 72 % for alv volume   PFT's  06/21/2017  FVC  2.51 (92%)  with DLCO  41/42 % corrects to 63  % for alv volume  - 06/21/2017  Walked RA x 3 laps @ 185 ft each stopped due to  End of study, fast pace, no sob or desat  - 12/22/2017 6 mw mod pace 480 m no desats off all gerd rx  And pred 10 mg daily      Despite hrct evidence of UIP this is assoc with RA and she has clincally done well to date just being treated for RA with various doses of pred/ embrel and is reluctant to change any rx at this point; in fact, clearly prefers alternative meds/diet to control the problem    F/u can therefore be in 6 months with pfts and she how she does off gerd rx in meatime (see uacs sep a/p)    I had an extended discussion with the patient reviewing all relevant studies completed to date and  lasting 15 to 20 minutes of a 25 minute visit    Each maintenance medication was reviewed in detail including most importantly the difference between maintenance and prns and under what circumstances the prns are to be triggered using an action plan format that is not reflected in the computer generated alphabetically organized AVS.    Please see AVS for specific instructions unique to this visit that I personally wrote and verbalized to the the pt in detail and then reviewed with pt  by my nurse highlighting any  changes in therapy recommended at today's visit to their plan of care.

## 2017-12-22 NOTE — Progress Notes (Signed)
SIX MIN WALK 12/22/2017 06/21/2017  Medications Deltasone 10mg  taken at 7:30am -  Supplimental Oxygen during Test? (L/min) No No  Laps 10 -  Partial Lap (in Meters) 0 -  Baseline BP (sitting) 120/70 -  Baseline Heartrate 96 -  Baseline Dyspnea (Borg Scale) 0 -  Baseline Fatigue (Borg Scale) 0 -  Baseline SPO2 97 -  BP (sitting) 122/72 -  Heartrate 87 -  Dyspnea (Borg Scale) 0.5 -  Fatigue (Borg Scale) 0 -  SPO2 97 -  BP (sitting) 120/68 -  Heartrate 82 -  SPO2 100 -  Stopped or Paused before Six Minutes No -  Distance Completed 480 -  Tech Comments: Pt walked at a moderate pace completing all required laps. Pt denied any complaints during walk. Pt walked at a fast pace successfully completing all required laps. Pt did not state any complaints while walking.

## 2018-02-07 ENCOUNTER — Encounter: Payer: Self-pay | Admitting: Family Medicine

## 2018-02-07 ENCOUNTER — Ambulatory Visit: Payer: Medicare Other | Admitting: Family Medicine

## 2018-02-07 VITALS — BP 124/80 | HR 78 | Temp 97.8°F | Resp 16 | Ht 66.0 in | Wt 143.0 lb

## 2018-02-07 DIAGNOSIS — R7303 Prediabetes: Secondary | ICD-10-CM | POA: Diagnosis not present

## 2018-02-07 DIAGNOSIS — Z8639 Personal history of other endocrine, nutritional and metabolic disease: Secondary | ICD-10-CM | POA: Diagnosis not present

## 2018-02-07 LAB — POCT GLYCOSYLATED HEMOGLOBIN (HGB A1C): Hemoglobin A1C: 5.7

## 2018-02-07 NOTE — Patient Instructions (Signed)
Your hemoglobin A1c has improved to 5.7.  Continue current diet and increasing physical activity.  Follow-up with specialist as scheduled. I will follow-up by phone with any abnormal laboratory results.   We will follow-up in 6 months

## 2018-02-07 NOTE — Progress Notes (Signed)
Subjective:    Chief Complaint  Patient presents with  . Follow-up     Patient is a 67 y.o. female who presents for evaluation of atherosclerotic coronary artery disease and prediabetes presents for a 3 month follow up. Recent history: patient does not perform home BP monitoring, no TIA's, no chest pain on exertion, no dyspnea on exertion, no swelling of ankles, no orthostatic dizziness or lightheadedness, no palpitations and no intermittent claudication symptoms. Ms. Jaskiewicz had a chest CT several months ago and was instructed to follow up in primary care for atherosclerosis. She is currently not on ASA or statin therapy.  Patient took statin therapy for 30 days and decided to discontinue when starting an all natural diet. She has been following all natural diet for over 5 months and states that she feels well. 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   Patient has a history of severe stage III rheumatoid arthritis with positive rheumatoid factor.  Patient experiences chronic pain and swelling in bilateral upper extremities mostly in the morning she describes pain as intermittent.  Patient is currently on Remicade per rheumatology.  She is scheduled to follow up with rheumatology in 1 month.   Past Medical History:  Diagnosis Date  . Allergic rhinitis due to pollen   . Carpal tunnel syndrome   . Neuromuscular disorder ([place])   . Pure hypercholesterolemia    Social History   Socioeconomic History  . Marital status: Single    Spouse name: Not on file  . Number of children: Not on file  . Years of education: Not on file  . Highest education level: Not on file  Social Needs  . Financial resource strain: Not on file  . Food insecurity - worry: Not on file  . Food insecurity - inability: Not on file  . Transportation needs - medical: Not on file  . Transportation needs - non-medical: Not on file  Occupational History  . Not on file  Tobacco Use  . Smoking status:  Former Smoker    Types: Cigarettes    Last attempt to quit: 06/24/1988    Years since quitting: 29.6  . Smokeless tobacco: Never Used  Substance and Sexual Activity  . Alcohol use: No    Alcohol/week: 0.6 oz    Types: 1 Glasses of wine per week  . Drug use: No  . Sexual activity: No  Other Topics Concern  . Not on file  Social History Narrative  . Not on file   Immunization History  Administered Date(s) Administered  . Influenza,inj,Quad PF,6+ Mos 10/22/2014, 10/27/2015, 08/10/2017  . Influenza-Unspecified 07/28/2016  . Pneumococcal Conjugate-13 11/23/2016  . Pneumococcal Polysaccharide-23 07/17/2014  . Tdap 07/17/2014  . Zoster 10/22/2014  No Known Allergies Family History  Problem Relation Age of Onset  . Cancer Father   . Alzheimer's disease Father   . Rheum arthritis Sister   . Colon cancer Neg Hx     Review of Systems Review of Systems  Constitutional: Negative.   HENT: Negative.   Eyes: Negative.   Respiratory: Negative.   Cardiovascular: Negative.  Negative for chest pain, palpitations and orthopnea.  Gastrointestinal: Negative for abdominal pain, blood in stool, constipation, diarrhea, heartburn, melena, nausea and vomiting.  Genitourinary: Negative.  Negative for dysuria, frequency, hematuria and urgency.  Musculoskeletal: Negative.   Skin:       Nodules to elbows and right index finger  Neurological: Negative.   Endo/Heme/Allergies: Negative.  Negative for environmental allergies and  polydipsia. Does not bruise/bleed easily.  Psychiatric/Behavioral: Negative.    Objective:    Physical Exam Physical Exam  Constitutional: She is oriented to person, place, and time and well-developed, well-nourished, and in no distress.  HENT:  Head: Normocephalic and atraumatic.  Right Ear: External ear normal.  Left Ear: External ear normal.  Nose: Nose normal.  Mouth/Throat: Oropharynx is clear and moist.  Eyes: Conjunctivae and EOM are normal. Pupils are equal,  round, and reactive to light.  Neck: Normal range of motion. Neck supple.  Cardiovascular: Normal rate, normal heart sounds and intact distal pulses.  Pulmonary/Chest: Effort normal and breath sounds normal.  Musculoskeletal: Normal range of motion.  Neurological: She is alert and oriented to person, place, and time. She has normal reflexes. Gait normal. GCS score is 15.  Skin: Skin is warm and dry.  Pinpoint , raised, hyperpigmented nodule to right index finger.   Psychiatric: Mood, memory, affect and judgment normal.   Lab Review  Will review labs as results become available   Assessment:      Plan:  Prediabetes Hemoglobin A1c was previously 6.1, which was consistent with prediabetes. Hemoglobin 11C has improved to 5.7. Discussed continuing all natural diet at length.  - HgB A1c  2. History of hyperlipidemia The 10-year ASCVD risk score Mikey Bussing DC Jr., et al., 2013) is: 7.9%   Values used to calculate the score:     Age: 43 years     Sex: Female     Is Non-Hispanic African American: Yes     Diabetic: No     Tobacco smoker: No     Systolic Blood Pressure: 751 mmHg     Is BP treated: No     HDL Cholesterol: 63 mg/dL     Total Cholesterol: 182 mg/dL  - Lipid Panel   RTC: 6 months for CPE   Donia Pounds  MSN, FNP-C [facility] [address] [place], [place] [address] [phone]

## 2018-02-08 ENCOUNTER — Telehealth: Payer: Self-pay

## 2018-02-08 LAB — LIPID PANEL
CHOL/HDL RATIO: 2.9 ratio (ref 0.0–4.4)
Cholesterol, Total: 182 mg/dL (ref 100–199)
HDL: 63 mg/dL (ref >39)
LDL CALC: 101 mg/dL — AB (ref 0–99)
Triglycerides: 89 mg/dL (ref 0–149)
VLDL Cholesterol Cal: 18 mg/dL (ref 5–40)

## 2018-02-08 NOTE — Telephone Encounter (Signed)
-----   Message from Dorena Dew, [place] sent at 02/08/2018  1:27 PM EDT ----- Regarding: lab results Please inform patient that cholesterol panel has improved. Continue to follow diet as discussed during appointment. Will recheck cholesterol level in 6 months.   Donia Pounds  MSN, FNP-C [facility] [address] [place], [place] [address] [phone]

## 2018-02-08 NOTE — Telephone Encounter (Signed)
Called and spoke with patient advised that cholesterol is improving. Advised to continue with diet and we will recheck levels in 6 months. Thanks!

## 2018-02-19 ENCOUNTER — Encounter: Payer: Self-pay | Admitting: Neurology

## 2018-02-19 ENCOUNTER — Ambulatory Visit: Payer: Medicare Other | Admitting: Neurology

## 2018-02-19 ENCOUNTER — Telehealth: Payer: Self-pay | Admitting: Neurology

## 2018-02-19 DIAGNOSIS — H532 Diplopia: Secondary | ICD-10-CM | POA: Diagnosis not present

## 2018-02-19 DIAGNOSIS — M0579 Rheumatoid arthritis with rheumatoid factor of multiple sites without organ or systems involvement: Secondary | ICD-10-CM

## 2018-02-19 DIAGNOSIS — M069 Rheumatoid arthritis, unspecified: Secondary | ICD-10-CM | POA: Insufficient documentation

## 2018-02-19 NOTE — Progress Notes (Signed)
[facility]  PATIENT: Lorraine Blair DOB: 1951/04/07  REFERRING DOCTOR OR PCP:  Dr. Marin Comment (ophth); Dr. Irene Pap (Rheum); Cammie Sickle, NP (PCP) SOURCE: Patient, notes from Dr. Marin Comment, laboratory results.  _________________________________   HISTORICAL  CHIEF COMPLAINT:  Chief Complaint  Patient presents with  . Decreased Visual Acuity    About 6 episodes of double vision, lasting 2-3 min. each, in the last 5-6 wks. only when looking with both eyes (goes away when she closes one eye or looks up) Needs clearance to restart Remicade for RA.  Episodes of double vision started while on Enbrel.  She switched to Remicade about 4 wks ago/fim  . Rheumatoid Arthritis    HISTORY OF PRESENT ILLNESS:  I had the pleasure of seeing the patient, Lorraine Blair, and [facility] for neurologic consultation regarding her transient diplopia.  She has a history of RA, diagnosed in December 2017.  She had joint pain, stiffness and swelling, worse in her hands.    Serology tests were positive.    She was placed on prednisone and Enbrel.    She was on Enbrel for about 9 months and then went on Remicade last month.   The Remicade is helping her RA better.   The episodes occurred on both drugs.    Many years ago, she had a single episode of diplopia lasting 2-3 minutes.   Since it never recurred, she did not see a doctor.   Vision was fine until about 6 weeks ago when she had another episode lasting 2-3 minutes with diplopia.    She is having about one episode weekly.   The diplopia is present when looking forward and down, better when looking ot the side and much better when looking up.    It is binocular (improved with closing either eye).   She has no other symptom occurring when she is experiencing the diplopia episode.  No other neurologic symptom is concurrent.     She has had a mild floating sensation, like being in a plane that is turning, many mornings that improves as  the day goes on.   There is no vertigo or lightheadedness.    No numbness, weakness or gait disturbance.   She saw Dr. Marin Comment who refers her for further evaluation.  She notes visual acuity is fine.     She has not had any MRI of the brain or orbits  She works part time as a Forensic scientist for food.    Last week, she had a single episode where she felt confused while driving but as soon as she turned onto another street, she felt back to normal.   REVIEW OF SYSTEMS: Constitutional: No fevers, chills, sweats, or change in appetite Eyes: No visual changes, double vision, eye pain Ear, nose and throat: No hearing loss, ear pain, nasal congestion, sore throat Cardiovascular: No chest pain, palpitations Respiratory: No shortness of breath at rest or with exertion.   No wheezes GastrointestinaI: No nausea, vomiting, diarrhea, abdominal pain, fecal incontinence Genitourinary: No dysuria, urinary retention or frequency.  No nocturia. Musculoskeletal:She has seropositive rheumatoid arthritis Integumentary: No rash, pruritus, skin lesions Neurological: as above Psychiatric: No depression at this time.  No anxiety Endocrine: No palpitations, diaphoresis, change in appetite, change in weigh or increased thirst Hematologic/Lymphatic: No anemia, purpura, petechiae. Allergic/Immunologic: No itchy/runny eyes, nasal congestion, recent allergic reactions, rashes  ALLERGIES: No Known Allergies  HOME MEDICATIONS:  Current Outpatient Medications:  .  cholecalciferol (VITAMIN D) 1000 units  tablet, Take 1,000 Units by mouth daily., Disp: , Rfl:  .  Cyanocobalamin (B-12 PO), Take 1 tablet by mouth daily., Disp: , Rfl:  .  inFLIXimab (REMICADE) 100 MG injection, Inject into the vein., Disp: , Rfl:  .  predniSONE (DELTASONE) 10 MG tablet, Take 1 tablet by mouth daily., Disp: , Rfl: 0 .  TURMERIC PO, Take 1 capsule by mouth daily., Disp: , Rfl:   PAST MEDICAL HISTORY: Past Medical History:  Diagnosis Date  .  Allergic rhinitis due to pollen   . Carpal tunnel syndrome   . Neuromuscular disorder ([place])   . Pure hypercholesterolemia     PAST SURGICAL HISTORY: Past Surgical History:  Procedure Laterality Date  . TUBAL LIGATION  1989    FAMILY HISTORY: Family History  Problem Relation Age of Onset  . Cancer Father   . Alzheimer's disease Father   . Rheum arthritis Sister   . Colon cancer Neg Hx     SOCIAL HISTORY:  Social History   Socioeconomic History  . Marital status: Single    Spouse name: Not on file  . Number of children: Not on file  . Years of education: Not on file  . Highest education level: Not on file  Occupational History  . Not on file  Social Needs  . Financial resource strain: Not on file  . Food insecurity:    Worry: Not on file    Inability: Not on file  . Transportation needs:    Medical: Not on file    Non-medical: Not on file  Tobacco Use  . Smoking status: Former Smoker    Types: Cigarettes    Last attempt to quit: 06/24/1988    Years since quitting: 29.6  . Smokeless tobacco: Never Used  Substance and Sexual Activity  . Alcohol use: No    Alcohol/week: 0.6 oz    Types: 1 Glasses of wine per week  . Drug use: No  . Sexual activity: Never  Lifestyle  . Physical activity:    Days per week: Not on file    Minutes per session: Not on file  . Stress: Not on file  Relationships  . Social connections:    Talks on phone: Not on file    Gets together: Not on file    Attends religious service: Not on file    Active member of club or organization: Not on file    Attends meetings of clubs or organizations: Not on file    Relationship status: Not on file  . Intimate partner violence:    Fear of current or ex partner: Not on file    Emotionally abused: Not on file    Physically abused: Not on file    Forced sexual activity: Not on file  Other Topics Concern  . Not on file  Social History Narrative  . Not on file     PHYSICAL EXAM  Vitals:    02/20/18 1004  BP: 126/78  Pulse: 76  Resp: 16  Weight: 143 lb (64.9 kg)  Height: 5\' 6"  (1.676 m)    Body mass index is 23.08 kg/m.   General: The patient is well-developed and well-nourished and in no acute distress  Eyes:  Funduscopic exam shows normal optic discs and retinal vessels.  Neck: The neck is supple, no carotid bruits are noted.  The neck is nontender.  Cardiovascular: The heart has a regular rate and rhythm with a normal S1 and S2. There were no murmurs, gallops or  rubs.    Skin: Extremities are without significant edema.  Musculoskeletal:  Back is nontender  Neurologic Exam  Mental status: The patient is alert and oriented x 3 at the time of the examination. The patient has apparent normal recent and remote memory, with an apparently normal attention span and concentration ability.   Speech is normal.  Cranial nerves: Extraocular movements are full. Pupils are equal, round, and reactive to light and accomodation.  Visual fields are full.  Facial symmetry is present. There is good facial sensation to soft touch bilaterally.Facial strength is normal.  Trapezius and sternocleidomastoid strength is normal. No dysarthria is noted.  The tongue is midline, and the patient has symmetric elevation of the soft palate. No obvious hearing deficits are noted.  Motor:  Muscle bulk is normal.   Tone is normal. Strength is  5 / 5 in all 4 extremities.   Sensory: Sensory testing is intact to pinprick, soft touch and vibration sensation in all 4 extremities.  Coordination: Cerebellar testing reveals good finger-nose-finger and heel-to-shin bilaterally.  Gait and station: Station is normal.   Gait is normal. Tandem gait is normal. Romberg is negative.   Reflexes: Deep tendon reflexes are symmetric and normal bilaterally.   Plantar responses are flexor.    DIAGNOSTIC DATA (LABS, IMAGING, TESTING) - I reviewed patient records, labs, notes, testing and imaging myself where  available.  Lab Results  Component Value Date   WBC 7.5 11/16/2017   HGB 12.2 11/16/2017   HCT 36.8 11/16/2017   MCV 88 11/16/2017   PLT 395 (H) 11/16/2017      Component Value Date/Time   NA 144 11/16/2017 1102   K 4.3 11/16/2017 1102   CL 102 11/16/2017 1102   CO2 23 11/16/2017 1102   GLUCOSE 96 11/16/2017 1102   GLUCOSE 101 (H) 08/10/2017 1125   BUN 9 11/16/2017 1102   CREATININE 0.87 11/16/2017 1102   CREATININE 0.93 08/10/2017 1125   CALCIUM 9.5 11/16/2017 1102   PROT 8.1 11/16/2017 1102   ALBUMIN 3.8 11/16/2017 1102   AST 24 11/16/2017 1102   ALT 15 11/16/2017 1102   ALKPHOS 61 11/16/2017 1102   BILITOT 0.3 11/16/2017 1102   GFRNONAA 70 11/16/2017 1102   GFRNONAA 64 08/10/2017 1125   GFRAA 80 11/16/2017 1102   GFRAA 74 08/10/2017 1125   Lab Results  Component Value Date   CHOL 182 02/07/2018   HDL 63 02/07/2018   LDLCALC 101 (H) 02/07/2018   TRIG 89 02/07/2018   CHOLHDL 2.9 02/07/2018   Lab Results  Component Value Date   HGBA1C 5.7 02/07/2018   No results found for: [id number] Lab Results  Component Value Date   TSH 2.163 10/27/2015       ASSESSMENT AND PLAN  Diplopia - Plan: MR BRAIN W WO CONTRAST, MR ORBITS W WO CONTRAST  Rheumatoid arthritis involving multiple sites with positive rheumatoid factor ([place])    In summary, she is a 67 year old woman had 6 or 7 episodes of transient diplopia lasting 2-3 minutes.  The diplopia is predominantly vertical, worsening with looking ahead or down.     She has rheumatoid arthritis and is being treated with Remicade.  Before that she was on Enbrel.  Episodes have occurred on both drugs.  We need to check an MRI of the brain and orbits to determine if she has any strokelike changes, inflammation or demyelination.  As her symptoms were transient, I discussed with her that there is a high likelihood that  an etiology would not be forthcoming but that the MRI will help to rule out treatable causes.  If symptoms  persist would also recommend checking an MRA to rule out vertebrobasilar insufficiency, though she did not have any other symptoms of this.  We will call her with the results of the studies and follow her up if the spells persist and based on the results.  Thank you for asking me to see Ms. Blucher.  Please let me know if I can be of further assistance with her or other patients in the future.  Richard A. Felecia Shelling, MD, [facility] 3/0/0923, 30:07 AM Certified in Neurology, Clinical Neurophysiology, Sleep Medicine, Pain Medicine and Neuroimaging  [facility] [address], [place], [place] [address] [phone]

## 2018-02-19 NOTE — Telephone Encounter (Signed)
[facility] [employer] order sent to GI no auth they will contact the pt to schedule.

## 2018-02-20 ENCOUNTER — Encounter: Payer: Self-pay | Admitting: Neurology

## 2018-02-27 ENCOUNTER — Ambulatory Visit
Admission: RE | Admit: 2018-02-27 | Discharge: 2018-02-27 | Disposition: A | Discharge status: Home / Self Care | Payer: Medicare Other | Source: Ambulatory Visit | Attending: Neurology | Admitting: Neurology

## 2018-02-27 DIAGNOSIS — H532 Diplopia: Secondary | ICD-10-CM | POA: Diagnosis not present

## 2018-02-27 MED ORDER — GADOBENATE DIMEGLUMINE 529 MG/ML IV SOLN
13.0000 mL | Freq: Once | INTRAVENOUS | Status: AC | PRN
  Administered 2018-02-27: 13 mL via INTRAVENOUS

## 2018-03-01 ENCOUNTER — Other Ambulatory Visit: Payer: Self-pay | Admitting: Neurology

## 2018-03-01 DIAGNOSIS — M0579 Rheumatoid arthritis with rheumatoid factor of multiple sites without organ or systems involvement: Secondary | ICD-10-CM

## 2018-03-02 ENCOUNTER — Telehealth: Payer: Self-pay | Admitting: *Deleted

## 2018-03-02 NOTE — Telephone Encounter (Signed)
Spoke with [place] reviewed below lab results. She verbalized understanding of same, is agreeable to additional lab test and will come in on Monday am.  Added to lab schedule/fim

## 2018-03-02 NOTE — Telephone Encounter (Signed)
-----   Message from Britt Bottom, MD sent at 03/01/2018  6:32 PM EDT ----- Please let her know that the MRI of the orbits was normal.  The MRI of the brain does show some spots, most consistent with typical age-related changes.  There was one spot centrally that was a little bit bigger -probably nothing to be concerned about.  however,  I would like to make sure that it is not related to an electrolyte disturbance.  I put in an order for a CMP and would like her to stop in sometime in the next few days for blood work

## 2018-03-05 ENCOUNTER — Other Ambulatory Visit (INDEPENDENT_AMBULATORY_CARE_PROVIDER_SITE_OTHER): Payer: Self-pay

## 2018-03-05 DIAGNOSIS — Z0289 Encounter for other administrative examinations: Secondary | ICD-10-CM

## 2018-03-05 DIAGNOSIS — M0579 Rheumatoid arthritis with rheumatoid factor of multiple sites without organ or systems involvement: Secondary | ICD-10-CM

## 2018-03-06 ENCOUNTER — Telehealth: Payer: Self-pay | Admitting: *Deleted

## 2018-03-06 LAB — COMPREHENSIVE METABOLIC PANEL
ALBUMIN: 3.8 g/dL (ref 3.6–4.8)
ALK PHOS: 87 IU/L (ref 39–117)
ALT: 16 IU/L (ref 0–32)
AST: 24 IU/L (ref 0–40)
Albumin/Globulin Ratio: 1.1 — ABNORMAL LOW (ref 1.2–2.2)
BUN/Creatinine Ratio: 12 (ref 12–28)
BUN: 9 mg/dL (ref 8–27)
Bilirubin Total: <0.2 mg/dL (ref 0.0–1.2)
CALCIUM: 9.3 mg/dL (ref 8.7–10.3)
CO2: 24 mmol/L (ref 20–29)
CREATININE: 0.78 mg/dL (ref 0.57–1.00)
Chloride: 102 mmol/L (ref 96–106)
GFR, EST AFRICAN AMERICAN: 91 mL/min/{1.73_m2} (ref >59)
GFR, EST NON AFRICAN AMERICAN: 79 mL/min/{1.73_m2} (ref >59)
Globulin, Total: 3.6 g/dL (ref 1.5–4.5)
Glucose: 81 mg/dL (ref 65–99)
Potassium: 4.3 mmol/L (ref 3.5–5.2)
SODIUM: 144 mmol/L (ref 134–144)
Total Protein: 7.4 g/dL (ref 6.0–8.5)

## 2018-03-06 NOTE — Telephone Encounter (Signed)
-----   Message from Britt Bottom, MD sent at 03/06/2018  9:47 AM EDT ----- Please let the patient know that the lab work is fine.

## 2018-03-06 NOTE — Telephone Encounter (Signed)
LMOM that lab work done in our office is fine.  She does not need to return this call unless she has questions/fim 

## 2018-03-16 ENCOUNTER — Encounter

## 2018-03-16 ENCOUNTER — Ambulatory Visit: Payer: Medicare Other | Admitting: Neurology

## 2018-03-20 ENCOUNTER — Telehealth: Payer: Self-pay | Admitting: Neurology

## 2018-03-20 NOTE — Telephone Encounter (Signed)
Dr. Lollie Sails with [facility] is requesting a call back to discuss patient. He can be reached at [phone].

## 2018-03-21 ENCOUNTER — Encounter: Payer: Self-pay | Admitting: Neurology

## 2018-03-21 ENCOUNTER — Ambulatory Visit (INDEPENDENT_AMBULATORY_CARE_PROVIDER_SITE_OTHER): Payer: Medicare Other | Admitting: Internal Medicine

## 2018-03-21 ENCOUNTER — Telehealth: Payer: Self-pay | Admitting: Neurology

## 2018-03-21 ENCOUNTER — Ambulatory Visit: Payer: Medicare Other | Admitting: Internal Medicine

## 2018-03-21 ENCOUNTER — Encounter: Payer: Self-pay | Admitting: Internal Medicine

## 2018-03-21 VITALS — BP 120/74 | HR 94 | Ht 66.0 in | Wt 144.0 lb

## 2018-03-21 DIAGNOSIS — J8410 Pulmonary fibrosis, unspecified: Secondary | ICD-10-CM

## 2018-03-21 LAB — PULMONARY FUNCTION TEST
DL/VA % PRED: 65 %
DL/VA: 3.29 ml/min/mmHg/L
DLCO unc % pred: 40 %
DLCO unc: 10.91 ml/min/mmHg
FEF 25-75 POST: 4.09 L/s
FEF 25-75 Pre: 4 L/sec
FEF2575-%CHANGE-POST: 2 %
FEF2575-%PRED-PRE: 206 %
FEF2575-%Pred-Post: 210 %
FEV1-%Change-Post: 0 %
FEV1-%PRED-PRE: 105 %
FEV1-%Pred-Post: 104 %
FEV1-PRE: 2.2 L
FEV1-Post: 2.19 L
FEV1FVC-%CHANGE-POST: 1 %
FEV1FVC-%Pred-Pre: 117 %
FEV6-%Change-Post: -2 %
FEV6-%PRED-PRE: 92 %
FEV6-%Pred-Post: 90 %
FEV6-PRE: 2.41 L
FEV6-Post: 2.35 L
FEV6FVC-%PRED-PRE: 103 %
FEV6FVC-%Pred-Post: 103 %
FVC-%CHANGE-POST: -2 %
FVC-%PRED-PRE: 89 %
FVC-%Pred-Post: 87 %
FVC-POST: 2.35 L
FVC-PRE: 2.41 L
POST FEV1/FVC RATIO: 93 %
POST FEV6/FVC RATIO: 100 %
PRE FEV1/FVC RATIO: 91 %
Pre FEV6/FVC Ratio: 100 %
RV % pred: 48 %
RV: 1.09 L
TLC % PRED: 66 %
TLC: 3.55 L

## 2018-03-21 NOTE — Telephone Encounter (Signed)
I spoke to Dr. Lollie Sails over the phone about Mrs.Whidden about her episodes of diplopia and her MRI.   She does not show any evidence of multiple sclerosis.  She should be safe to get back on a biologic agent such as Remicade for her rheumatoid arthritis.

## 2018-03-21 NOTE — Progress Notes (Signed)
PFT done today. 

## 2018-03-21 NOTE — Progress Notes (Signed)
Subjective:    Patient ID: Lorraine Blair, female    DOB: 1951-06-28    MRN: [id number]    Brief patient profile:  80 yobf  Quit smoking in 1989  With intermittent sinus infections maybe twice a year and gradually improving  until the day after arrived back from [place] Aug 4098  severe productive cough / assoc with chest congestion and post drainage x 2 weeks > lots of white mucus seemed to improve except for harsh barking quality and present ever since so referred to pulmonary clinic 11/03/2016 by  Carlton Adam NP/ has RA referred to [facility]      History of Present Illness  11/03/2016 1st [facility] office visit/ Lorraine Blair   Chief Complaint  Patient presents with  . Pulmonary Consult    Dr. Balinda Quails referred pt, cough since August 2016, ENT said she did not have GERD, tramadol helped with the coughing, cough on and off today, early mornings coughs up clear/yellow mucus  pt unclear on date of onset but records indicate  jan 2017 sought care for cough of duration x  6 months and rx with zyrtec and and something for reflux but no better / cough worse when wakes up each am but sleeps fine p taking tramadol and really no excess or overtly purulent am sputum and cough tends to worsen as day goes on unless takes tramadol with sensation of pnds but no rhinorhea.  Has h/o severe sinus infections ever since adult so went to [facility]  ENT July 2017 > ? Might be reflux  rx tramadol up to twice daily seems to help  rec First take delsym two tsp every 12 hours and supplement if needed with  tramadol 50 mg up to 2 every 4 hours to suppress the urge to cough at all or even clear your throat.    Prednisone 10 mg take  4 each am x 2 days,   2 each am x 2 days,  1 each am x 2 days and stop (this is to eliminate allergies and inflammation from coughing) Protonix (pantoprazole) Take 30-60 min before first meal of the day and Pepcid 20 mg one bedtime plus chlorpheniramine 4 mg x 2 at bedtime  (both available over the counter)  until cough is completely gone for at least a week without the need for cough suppression GERD diet      11/29/2016  f/u ov/Lorraine Blair re:  Cough x aug 2016  Chief Complaint  Patient presents with  . Follow-up    Cough had improved on prednisone and then worsened once she finished med. She has noticed that she coughs every time she bends over and the more she talks. Cough is occ prod with minimal clear sputum.    cough resolved 3 d into protocol  but continued the tramadol until it ran out and then cough recurred  Still on ppi qam / h2hs  Cough is worse after supper / not typically waking her/ more related to voice use rec Please see patient coordinator before you leave today  to schedule High resolution CT chest  First take delsym two tsp every 12 hours and supplement if needed with  tramadol 50 mg up to 2 every 4 hours to suppress the urge to cough at all or even clear your throat. Swallowing water or using ice chips/non mint and menthol containing candies (such as lifesavers or sugarless jolly ranchers) are also effective.  You should rest your voice and avoid  activities that you know make you cough. Once you have eliminated the cough for 3 straight days try reducing the tramadol first,  then the delsym as tolerated.   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 Follow-up    PFT's done. Pt states having increased cough for the  past month. Cough is prod with clear to white sputum.    cough is worse since stopped embrel / despite pred At 10 mg and gabapentin 100 tid / ppi ac and pepcid 20 mg  Back on embrel x 4 weeks and prednisone  X since Feb 2018 Not limited by breathing from desired activities   No noct cough  rec Increase gabapentin 300mg  three times  Daily  For cough > tessalon 200 mg three times daily as needed but if the gabapentin works you should not need the tessalon        03/21/2018  f/u ov/Lorraine Blair re:  RA/ ? ild on prednisone 10 mg daily and stopped remicade January 25 2018 due to ? Viz isssues  Chief Complaint  Patient presents with  . Follow-up    PFT's done. Breathing is doing well and she is no longer coughing.   Dyspnea:  Not limited by breathing from desired activities   Cough: none Sleep: ok  resp wise  SABA use:  None  No obvious day to day or daytime variability or assoc excess/ purulent sputum or mucus plugs or hemoptysis or cp or chest tightness, subjective wheeze or overt sinus or hb symptoms. No unusual exposure hx or h/o childhood pna/ asthma or knowledge of premature birth.  Sleeping flat  without nocturnal  or early am exacerbation  of respiratory  c/o's or need for noct saba. Also denies any obvious fluctuation of symptoms with weather or environmental changes or other aggravating or alleviating factors except as outlined above   Current Allergies, Complete Past Medical History, Past Surgical History, Family History, and Social History were reviewed in [employer] record.  ROS  The following are not active complaints unless bolded Hoarseness, sore throat, dysphagia, dental problems, itching, sneezing,  nasal congestion or discharge of excess mucus or purulent secretions, ear ache,   fever, chills, sweats, unintended wt loss or wt gain, classically pleuritic or exertional cp,  orthopnea pnd or arm/hand swelling  or leg swelling, presyncope, palpitations, abdominal  pain, anorexia, nausea, vomiting, diarrhea  or change in bowel habits or change in bladder habits, change in stools or change in urine, dysuria, hematuria,  rash, arthralgias, visual complaints no better off remicade  headache, numbness, weakness or ataxia or problems with walking or coordination,  change in mood or  memory.        Current Meds  Medication Sig  . cholecalciferol (VITAMIN D) 1000 units tablet Take 1,000 Units by mouth daily.  Marland Kitchen  Cyanocobalamin (B-12 PO) Take 1 tablet by mouth daily.  Marland Kitchen inFLIXimab (REMICADE) 100 MG injection Inject into the vein.  . predniSONE (DELTASONE) 10 MG tablet Take 1 tablet by mouth daily.  . TURMERIC PO Take 1 capsule by mouth daily.                   Objective:   Physical Exam   amb bf nad  03/21/2018         144  12/22/2017         144  06/21/2017         157  01/31/2017       159  01/02/2017       155      11/03/16 162 lb 3.2 oz (73.6 kg)  10/24/16 158 lb (71.7 kg)  05/02/16 163 lb (73.9 kg)      Vital signs reviewed - Note on arrival 02 sats  98% on RA       HEENT: nl dentition, turbinates bilaterally, and oropharynx. Nl external ear canals without cough reflex   NECK :  without JVD/Nodes/TM/ nl carotid upstrokes bilaterally   LUNGS: no acc muscle use,  Nl contour chest  With minimal insp crackles bases  bilaterally without cough on insp or exp maneuvers   CV:  RRR  no s3 or murmur or increase in P2, and no edema   ABD:  soft and nontender with nl inspiratory excursion in the supine position. No bruits or organomegaly appreciated, bowel sounds nl  MS:  Nl gait/ ext warm without deformities, calf tenderness, cyanosis or clubbing No obvious joint restrictions   SKIN: warm and dry without lesions    NEURO:  alert, approp, nl sensorium with  no motor or cerebellar deficits apparent.                    Assessment & Plan:

## 2018-03-21 NOTE — Patient Instructions (Addendum)
I would rec monitoring your oxygen level with similar exercise weekly and call if downward trend established    Otherwise follow up is as needed or per your rheumatologist's rec

## 2018-03-22 ENCOUNTER — Encounter: Payer: Self-pay | Admitting: Internal Medicine

## 2018-03-22 NOTE — Telephone Encounter (Signed)
Letter from [facility], along with MRI brain results from 02/27/18 faxed to Dr. Lollie Sails, fax# [phone], with fax confirmation received/fim

## 2018-03-22 NOTE — Assessment & Plan Note (Signed)
HRCT chest  12/05/2016 1. 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 2. Nonspecific mild mediastinal lymphadenopathy, probably reactive. 3. Aortic atherosclerosis. 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 4. Left main and 3 vessel coronary atherosclerosis. - PFT's  01/02/2017  FVC  2.52 (92%) s obst p nothing prior to study with DLCO  46/47c  % corrects to 72 % for alv volume   PFT's  06/21/2017  FVC  2.51 (92%)  with DLCO  41/42 % corrects to 63  % for alv volume  - 06/21/2017  Walked RA x 3 laps @ 185 ft each stopped due to  End of study, fast pace, no sob or desat  - 12/22/2017 6 mw mod pace 480 m no desats off all gerd rx   And prednisone 10 mg daily  - PFT's  03/21/2018  FVC  2.41 (89%)  with DLCO  40 % corrects to 65 % for alv volume    I had an extended discussion with the patient reviewing all relevant studies completed to date and  lasting 15 to 20 minutes of a 25 minute visit on the following ongoing concerns:   1) no evidence of dz progression  2) best way to monitor ILD related to RA is pulse ox with ex > she has one but not using so suggested at least once a week she do so  3) no role for further pfts unless directed by new symptoms/desats or to monitor for drug tox (eg mtx) from RA drugs - can do yearly if needed by rheum  4) Each maintenance medication was reviewed in detail including most importantly the difference between maintenance and as needed and under what circumstances the prns are to be used.  Please see AVS for specific  Instructions which are unique to this visit and I personally typed out  which were reviewed in detail in writing with the patient and a copy provided.

## 2018-04-30 ENCOUNTER — Encounter: Payer: Self-pay | Admitting: Family Medicine

## 2018-07-30 IMAGING — CT CT CHEST HIGH RESOLUTION W/O CM
2 of 6 series · 15 of 36 positions shown, 18 images · non-contrast
Comparison: 11/03/2016 chest radiograph.

CLINICAL DATA: Cough. Rheumatoid arthritis. Clinical and chest
radiographic concern for interstitial lung disease. Remote smoking
history per [REDACTED].

EXAM:
CT CHEST WITHOUT CONTRAST
TECHNIQUE: Multidetector CT imaging of the chest was performed following the
standard protocol without intravenous contrast. High resolution
imaging of the lungs, as well as inspiratory and expiratory imaging,
was performed.

[Series 2: high resolution · axial · 0.67mm/px · z∈[+1124,+1382]mm · 12 of 145 slices shown, 15 images]
[im 8/145  mediastinal]
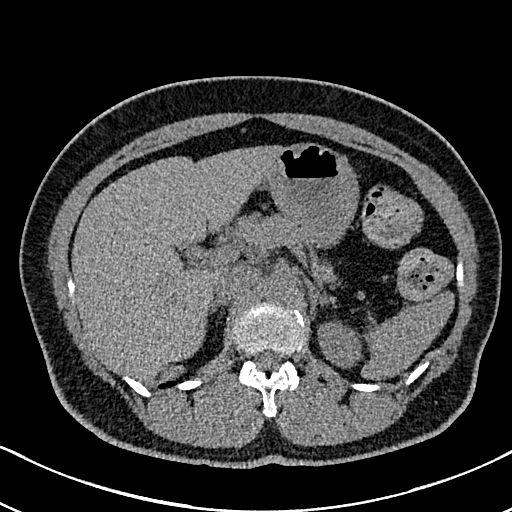
[im 8/145  lung]
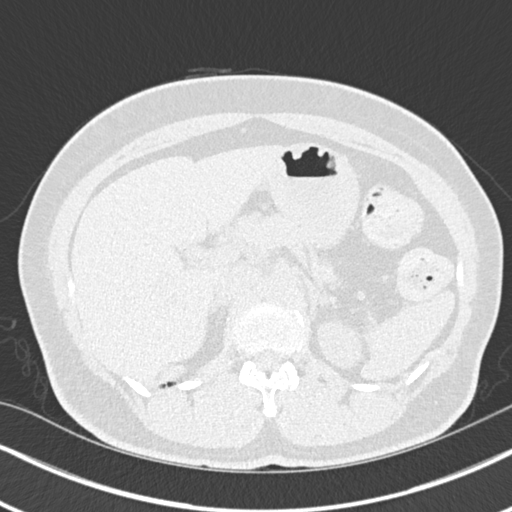
[im 23/145  lung]
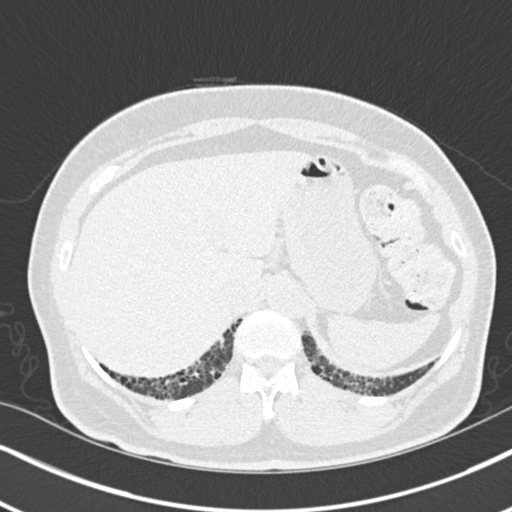
[im 31/145  lung]
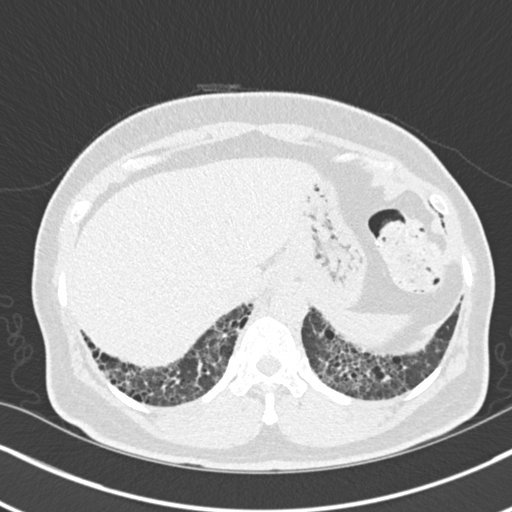
[im 46/145  lung]
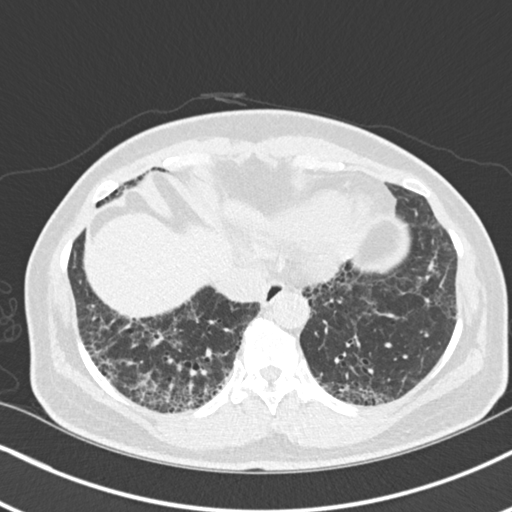
[im 54/145  mediastinal]
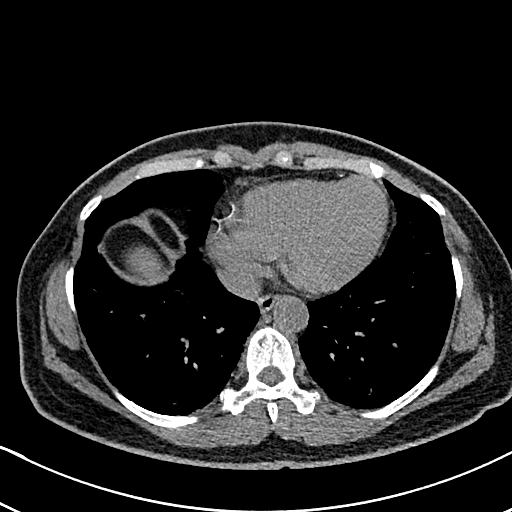
[im 54/145  lung]
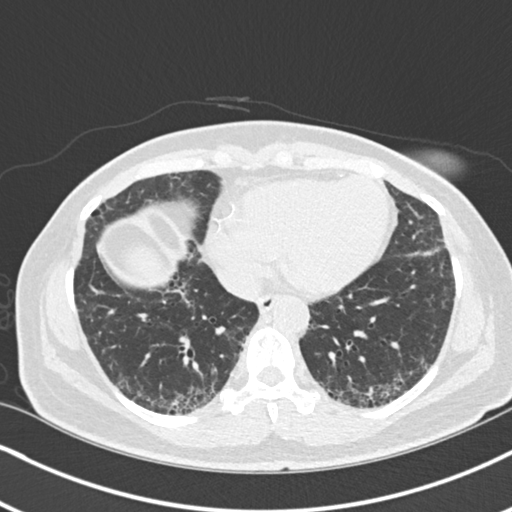
[im 69/145  lung]
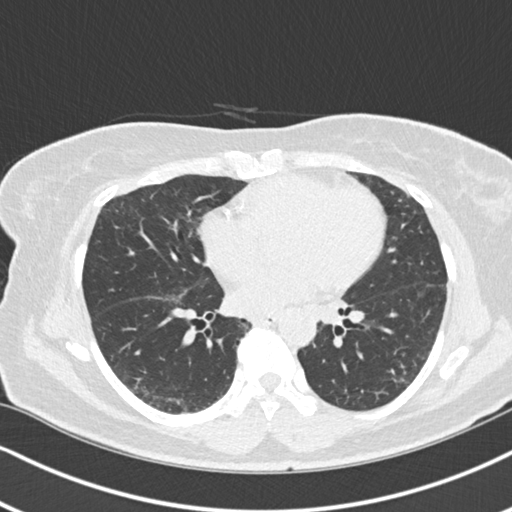
[im 76/145  lung]
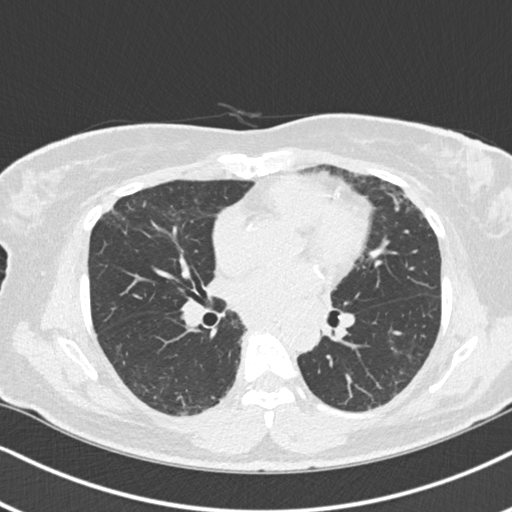
[im 91/145  lung]
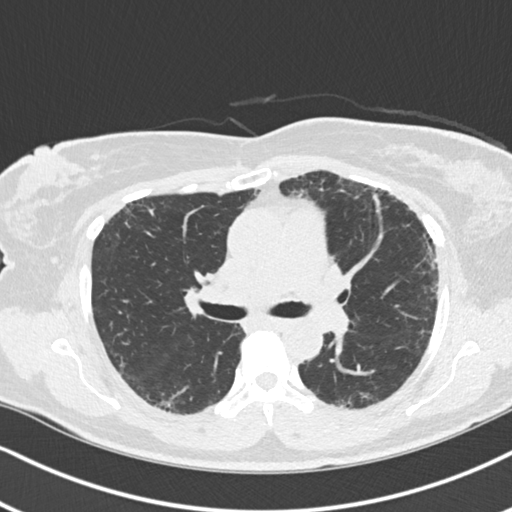
[im 99/145  mediastinal]
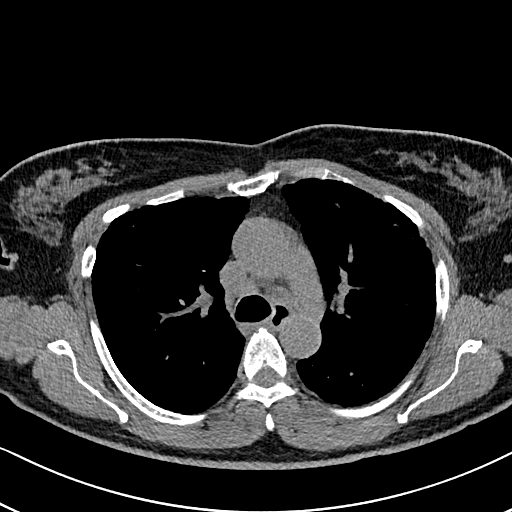
[im 99/145  lung]
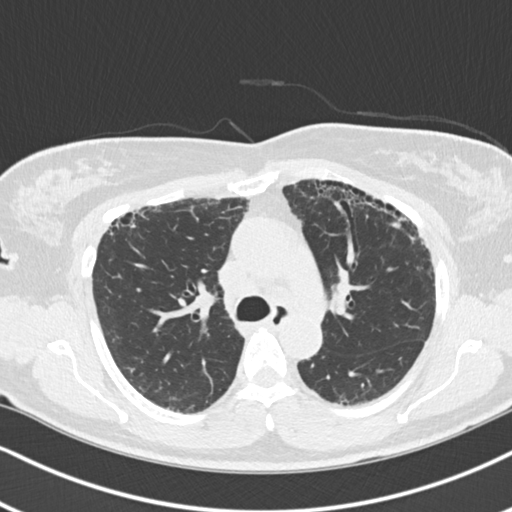
[im 114/145  lung]
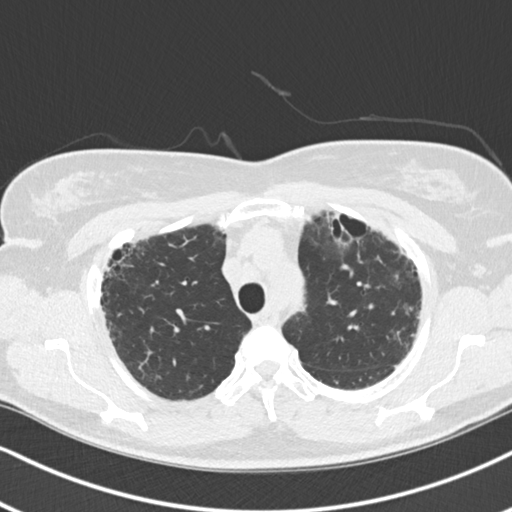
[im 122/145  lung]
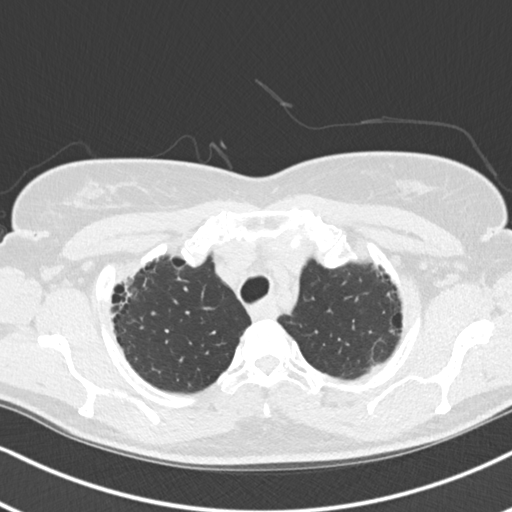
[im 137/145  lung]
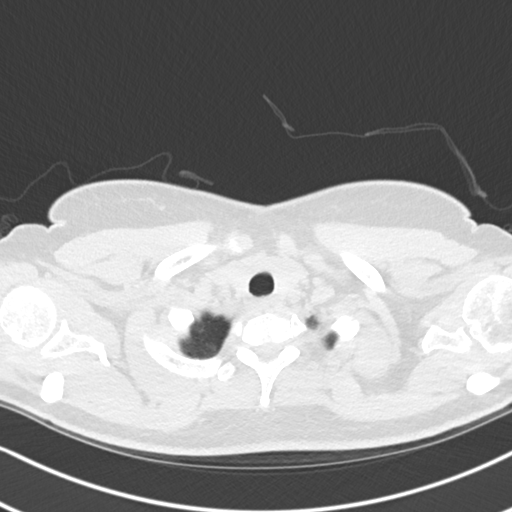

[Series 5: coronal · coronal · 0.57mm/px · 3 of 121 slices shown]
[im 25/121  lung]
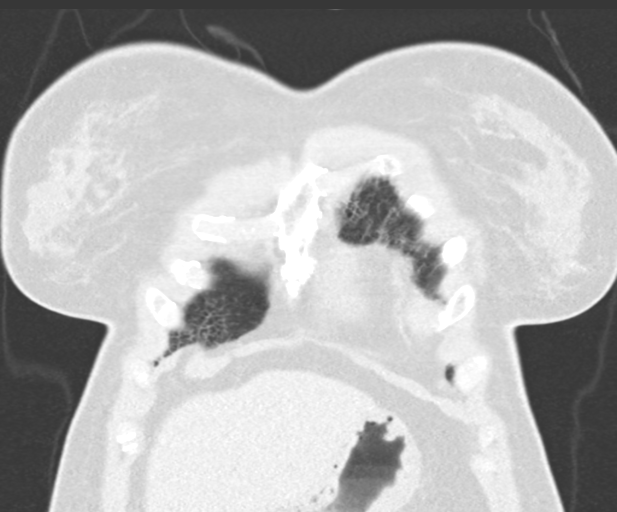
[im 49/121  lung]
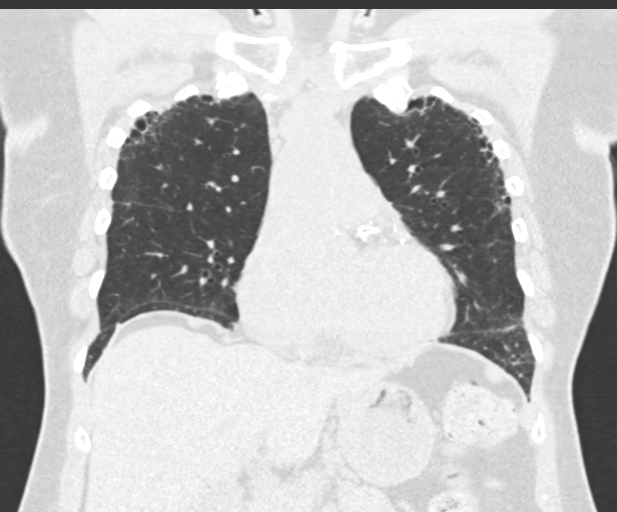
[im 73/121  lung]
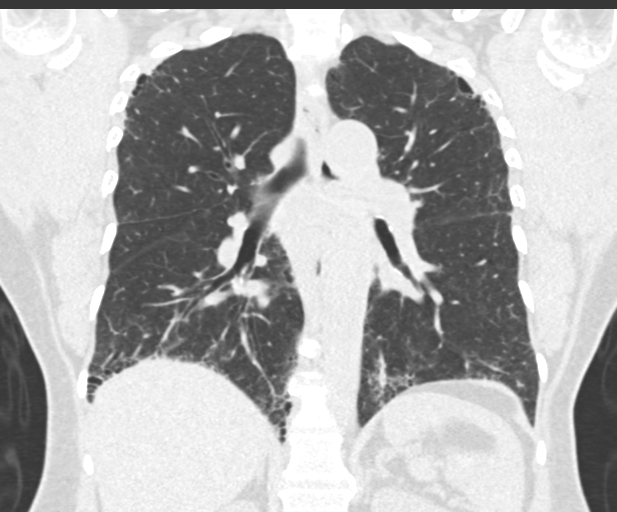

[15 of 36 positions shown; findings below may reference images not displayed]

FINDINGS: Cardiovascular: Normal heart size. No significant pericardial
fluid/thickening. Left main, left anterior descending, left
circumflex and right coronary atherosclerosis. Atherosclerotic
nonaneurysmal thoracic aorta. Aberrant nonaneurysmal right
subclavian artery arising from the distal aortic arch with
retroesophageal course. Normal caliber pulmonary arteries.

Mediastinum/Nodes: No discrete thyroid nodules. Unremarkable
esophagus. No axillary adenopathy. Mild right paratracheal
adenopathy measuring up to 1.0 cm (series 2/ image 31). Mildly
enlarged 1.0 cm subcarinal node (series 2/ image 59). New additional
pathologically enlarged mediastinal or gross hilar lymph nodes on
this noncontrast scan.

Lungs/Pleura: No pneumothorax. No pleural effusion. No acute
consolidative airspace disease, lung masses or significant pulmonary
nodules. There is patchy confluent subpleural reticulation and mild
ground-glass attenuation relatively symmetrically involving both
lungs with a basilar predominance and associated traction
bronchiectasis and mild architectural distortion. There are multiple
regions of moderate honeycombing in the bilateral upper and lower
lobes. No significant air trapping on the expiration sequence.

Upper abdomen: Unremarkable.

Musculoskeletal: No aggressive appearing focal osseous lesions.
Moderate thoracic spondylosis.
IMPRESSION: 1. Basilar predominant fibrotic interstitial lung disease with
honeycombing, considered diagnostic of usual interstitial pneumonia
(UIP) pattern due to rheumatoid arthritis.
2. Nonspecific mild mediastinal lymphadenopathy, probably reactive.
3. Aortic atherosclerosis. Aberrant right subclavian artery arising
from the distal aortic arch with retroesophageal course.
4. Left main and 3 vessel coronary atherosclerosis.

## 2018-08-10 ENCOUNTER — Encounter: Payer: Self-pay | Admitting: Family Medicine

## 2018-08-10 ENCOUNTER — Ambulatory Visit: Payer: Medicare Other | Admitting: Family Medicine

## 2018-08-10 VITALS — BP 117/83 | HR 74 | Temp 98.0°F | Resp 16 | Ht 66.0 in | Wt 144.0 lb

## 2018-08-10 DIAGNOSIS — E785 Hyperlipidemia, unspecified: Secondary | ICD-10-CM

## 2018-08-10 DIAGNOSIS — R7303 Prediabetes: Secondary | ICD-10-CM | POA: Diagnosis not present

## 2018-08-10 DIAGNOSIS — M069 Rheumatoid arthritis, unspecified: Secondary | ICD-10-CM | POA: Diagnosis not present

## 2018-08-10 LAB — POCT GLYCOSYLATED HEMOGLOBIN (HGB A1C): Hemoglobin A1C: 5.7 % — AB (ref 4.0–5.6)

## 2018-08-10 NOTE — Patient Instructions (Signed)
 Dyslipidemia Dyslipidemia is an imbalance of waxy, fat-like substances (lipids) in the blood. The body needs lipids in small amounts. Dyslipidemia often involves a high level of cholesterol or triglycerides, which are types of lipids. Common forms of dyslipidemia include:  High levels of bad cholesterol (LDL cholesterol). LDL is the type of cholesterol that causes fatty deposits (plaques) to build up in the blood vessels that carry blood away from your heart (arteries).  Low levels of good cholesterol (HDL cholesterol). HDL cholesterol is the type of cholesterol that protects against heart disease. High levels of HDL remove the LDL buildup from arteries.  High levels of triglycerides. Triglycerides are a fatty substance in the blood that is linked to a buildup of plaques in the arteries.  You can develop dyslipidemia because of the genes you are born with (primary dyslipidemia) or changes that occur during your life (secondary dyslipidemia), or as a side effect of certain medical treatments. What are the causes? Primary dyslipidemia is caused by changes (mutations) in genes that are passed down through families (inherited). These mutations cause several types of dyslipidemia. Mutations can result in disorders that make the body produce too much LDL cholesterol or triglycerides, or not enough HDL cholesterol. These disorders may lead to heart disease, arterial disease, or stroke at an early age. Causes of secondary dyslipidemia include certain lifestyle choices and diseases that lead to dyslipidemia, such as:  Eating a diet that is high in animal fat.  Not getting enough activity or exercise (having a sedentary lifestyle).  Having diabetes, kidney disease, liver disease, or thyroid disease.  Drinking large amounts of alcohol.  Using certain types of drugs.  What increases the risk? You may be at greater risk for dyslipidemia if you are an older man or if you are a woman who has gone  through menopause. Other risk factors include:  Having a family history of dyslipidemia.  Taking certain medicines, including birth control pills, steroids, some diuretics, beta-blockers, and some medicines forHIV.  Smoking cigarettes.  Eating a high-fat diet.  Drinking large amounts of alcohol.  Having certain medical conditions such as diabetes, polycystic ovary syndrome (PCOS), pregnancy, kidney disease, liver disease, or hypothyroidism.  Not exercising regularly.  Being overweight or obese with too much belly fat.  What are the signs or symptoms? Dyslipidemia does not usually cause any symptoms. Very high lipid levels can cause fatty bumps under the skin (xanthomas) or a white or gray ring around the black center (pupil) of the eye. Very high triglyceride levels can cause inflammation of the pancreas (pancreatitis). How is this diagnosed? Your health care provider may diagnose dyslipidemia based on a routine blood test (fasting blood test). Because most people do not have symptoms of the condition, this blood testing (lipid profile) is done on adults age 20 and older and is repeated every 5 years. This test checks:  Total cholesterol. This is a measure of the total amount of cholesterol in your blood, including LDL cholesterol, HDL cholesterol, and triglycerides. A healthy number is below 200.  LDL cholesterol. The target number for LDL cholesterol is different for each person, depending on individual risk factors. For most people, a number below 100 is healthy. Ask your health care provider what your LDL cholesterol number should be.  HDL cholesterol. An HDL level of 60 or higher is best because it helps to protect against heart disease. A number below 40 for men or below 50 for women increases the risk for heart disease.  Triglycerides.   A healthy triglyceride number is below 150.  If your lipid profile is abnormal, your health care provider may do other blood tests to get more  information about your condition. How is this treated? Treatment depends on the type of dyslipidemia that you have and your other risk factors for heart disease and stroke. Your health care provider will have a target range for your lipid levels based on this information. For many people, treatment starts with lifestyle changes, such as diet and exercise. Your health care provider may recommend that you:  Get regular exercise.  Make changes to your diet.  Quit smoking if you smoke.  If diet changes and exercise do not help you reach your goals, your health care provider may also prescribe medicine to lower lipids. The most commonly prescribed type of medicine lowers your LDL cholesterol (statin drug). If you have a high triglyceride level, your provider may prescribe another type of drug (fibrate) or an omega-3 fish oil supplement, or both. Follow these instructions at home:  Take over-the-counter and prescription medicines only as told by your health care provider. This includes supplements.  Get regular exercise. Start an aerobic exercise and strength training program as told by your health care provider. Ask your health care provider what activities are safe for you. Your health care provider may recommend: ? 30 minutes of aerobic activity 4-6 days a week. Brisk walking is an example of aerobic activity. ? Strength training 2 days a week.  Eat a healthy diet as told by your health care provider. This can help you reach and maintain a healthy weight, lower your LDL cholesterol, and raise your HDL cholesterol. It may help to work with a diet and nutrition specialist (dietitian) to make a plan that is right for you. Your dietitian or health care provider may recommend: ? Limiting your calories, if you are overweight. ? Eating more fruits, vegetables, whole grains, fish, and lean meats. ? Limiting saturated fat, trans fat, and cholesterol.  Follow instructions from your health care provider  or dietitian about eating or drinking restrictions.  Limit alcohol intake to no more than one drink per day for nonpregnant women and two drinks per day for men. One drink equals 12 oz of beer, 5 oz of wine, or 1 oz of hard liquor.  Do not use any products that contain nicotine or tobacco, such as cigarettes and e-cigarettes. If you need help quitting, ask your health care provider.  Keep all follow-up visits as told by your health care provider. This is important. Contact a health care provider if:  You are having trouble sticking to your exercise or diet plan.  You are struggling to quit smoking or control your use of alcohol. Summary  Dyslipidemia is an imbalance of waxy, fat-like substances (lipids) in the blood. The body needs lipids in small amounts. Dyslipidemia often involves a high level of cholesterol or triglycerides, which are types of lipids.  Treatment depends on the type of dyslipidemia that you have and your other risk factors for heart disease and stroke.  For many people, treatment starts with lifestyle changes, such as diet and exercise. Your health care provider may also prescribe medicine to lower lipids. This information is not intended to replace advice given to you by your health care provider. Make sure you discuss any questions you have with your health care provider. Document Released: 11/12/2013 Document Revised: 07/04/2016 Document Reviewed: 07/04/2016 Elsevier Interactive Patient Education  2018 Elsevier Inc.  

## 2018-08-10 NOTE — Progress Notes (Signed)
  Patient [facility]   Progress Note: General Provider: Lanae Boast, FNP  SUBJECTIVE:   Lorraine Blair is a 67 y.o. female who  has a past medical history of Allergic rhinitis due to pollen, Carpal tunnel syndrome, Neuromuscular disorder ([place]), and Pure hypercholesterolemia.. Patient presents today for Hyperlipidemia and Follow-up (prediabetes ) Patient states that she was being weaned off prednisone and when she go to 5mg , symptoms worsened. Now remicade has been increased and she is on 7.5 mg of prednisone daily. Patient states that she has eliminated sugar, dairy and meat from her diet. Patient states that she is doing well. No problems or concerns today.  Review of Systems  Constitutional: Negative.   HENT: Negative.   Eyes: Negative.   Respiratory: Negative.   Cardiovascular: Negative.   Gastrointestinal: Negative.   Genitourinary: Negative.   Musculoskeletal: Negative.   Skin: Negative.   Neurological: Negative.   Psychiatric/Behavioral: Negative.      OBJECTIVE: BP 117/83 (BP Location: Left Arm, Patient Position: Sitting, Cuff Size: Normal)   Pulse 74   Temp 98 F (36.7 C) (Oral)   Resp 16   Ht 5\' 6"  (1.676 m)   Wt 144 lb (65.3 kg)   SpO2 100%   BMI 23.24 kg/m   Physical Exam  Constitutional: She is oriented to person, place, and time. She appears well-developed and well-nourished. No distress.  HENT:  Head: Normocephalic and atraumatic.  Eyes: Pupils are equal, round, and reactive to light. Conjunctivae and EOM are normal.  Neck: Normal range of motion.  Cardiovascular: Normal rate, regular rhythm, normal heart sounds and intact distal pulses.  Pulmonary/Chest: Effort normal and breath sounds normal. No respiratory distress.  Abdominal: Soft. Bowel sounds are normal. She exhibits no distension.  Musculoskeletal: Normal range of motion.  Neurological: She is alert and oriented to person, place, and time.  Skin: Skin  is warm and dry.  Psychiatric: She has a normal mood and affect. Her behavior is normal. Thought content normal.  Nursing note and vitals reviewed.   ASSESSMENT/PLAN:   1. Prediabetes Continue with diet and exercise.  - HgB A1c- 5.7 no change since last visit.  - Comprehensive metabolic panel - Lipid Panel  2. Hyperlipidemia LDL goal <100 Labs ordered today.  - Lipid Panel  3. Rheumatoid arthritis involving multiple sites, unspecified rheumatoid factor presence [facility]) Patient followed by rheumatology.  - Comprehensive metabolic panel        The patient was given clear instructions to go to ER or return to medical center if symptoms do not improve, worsen or new problems develop. The patient verbalized understanding and agreed with plan of care.   Ms. Doug Sou. Nathaneil Canary, FNP-BC [facility] [address] [place], [place] [address] [phone]     This note has been created with Dragon speech recognition software and smart phrase technology. Any transcriptional errors are unintentional.

## 2018-08-11 LAB — COMPREHENSIVE METABOLIC PANEL
ALT: 49 IU/L — ABNORMAL HIGH (ref 0–32)
AST: 50 IU/L — ABNORMAL HIGH (ref 0–40)
Albumin/Globulin Ratio: 1 — ABNORMAL LOW (ref 1.2–2.2)
Albumin: 3.9 g/dL (ref 3.6–4.8)
Alkaline Phosphatase: 88 IU/L (ref 39–117)
BUN/Creatinine Ratio: 19 (ref 12–28)
BUN: 16 mg/dL (ref 8–27)
Bilirubin Total: 0.2 mg/dL (ref 0.0–1.2)
CO2: 24 mmol/L (ref 20–29)
Calcium: 9.7 mg/dL (ref 8.7–10.3)
Chloride: 103 mmol/L (ref 96–106)
Creatinine, Ser: 0.85 mg/dL (ref 0.57–1.00)
GFR calc Af Amer: 82 mL/min/{1.73_m2} (ref >59)
GFR calc non Af Amer: 71 mL/min/{1.73_m2} (ref >59)
Globulin, Total: 3.9 g/dL (ref 1.5–4.5)
Glucose: 86 mg/dL (ref 65–99)
Potassium: 4.2 mmol/L (ref 3.5–5.2)
Sodium: 143 mmol/L (ref 134–144)
Total Protein: 7.8 g/dL (ref 6.0–8.5)

## 2018-08-11 LAB — LIPID PANEL
Chol/HDL Ratio: 3 ratio (ref 0.0–4.4)
Cholesterol, Total: 210 mg/dL — ABNORMAL HIGH (ref 100–199)
HDL: 71 mg/dL (ref >39)
LDL Calculated: 121 mg/dL — ABNORMAL HIGH (ref 0–99)
Triglycerides: 91 mg/dL (ref 0–149)
VLDL Cholesterol Cal: 18 mg/dL (ref 5–40)

## 2019-02-08 ENCOUNTER — Ambulatory Visit: Payer: Medicare Other | Admitting: Family Medicine

## 2019-02-08 ENCOUNTER — Encounter: Payer: Self-pay | Admitting: Family Medicine

## 2019-02-08 ENCOUNTER — Other Ambulatory Visit: Payer: Self-pay

## 2019-02-08 VITALS — BP 126/80 | HR 73 | Temp 98.5°F | Resp 16 | Ht 66.0 in | Wt 157.4 lb

## 2019-02-08 DIAGNOSIS — M069 Rheumatoid arthritis, unspecified: Secondary | ICD-10-CM

## 2019-02-08 DIAGNOSIS — E785 Hyperlipidemia, unspecified: Secondary | ICD-10-CM | POA: Diagnosis not present

## 2019-02-08 DIAGNOSIS — R7303 Prediabetes: Secondary | ICD-10-CM | POA: Diagnosis not present

## 2019-02-08 LAB — POCT GLYCOSYLATED HEMOGLOBIN (HGB A1C)
HbA1c, POC (prediabetic range): 6.3 % (ref 5.7–6.4)
Hemoglobin A1C: 6.3 % — AB (ref 4.0–5.6)

## 2019-02-08 LAB — GLUCOSE, POCT (MANUAL RESULT ENTRY): POC Glucose: 96 mg/dl (ref 70–99)

## 2019-02-08 NOTE — Progress Notes (Signed)
Patient [facility]   Progress Note: General Provider: Lanae Boast, FNP  SUBJECTIVE:   Lorraine Blair is a 68 y.o. female who  has a past medical history of Allergic rhinitis due to pollen, Carpal tunnel syndrome, Neuromuscular disorder ([place]), and Pure hypercholesterolemia.. Patient presents today for Diabetes   Patient is doing well. She states that she has been more flexible with her diet since the last visit and has increased the intake of sweets. She states that she continues to be active. 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 Has some concerns about being immunocompromised in the wake of the COVID 19 pandemic. She reports compliance with her medications. Denies chest pain , SOB, dizziness or leg swelling. No reported side effects of medication.   Review of Systems  Constitutional: Negative.   HENT: Negative.   Eyes: Negative.   Respiratory: Negative.   Cardiovascular: Negative.   Gastrointestinal: Negative.   Genitourinary: Negative.   Musculoskeletal: Negative.   Skin: Negative.   Neurological: Negative.   Psychiatric/Behavioral: Negative.      OBJECTIVE: BP 126/80   Pulse 73   Temp 98.5 F (36.9 C)   Resp 16   Ht 5\' 6"  (1.676 m)   Wt 157 lb 6.4 oz (71.4 kg)   SpO2 98%   BMI 25.41 kg/m   Wt Readings from Last 3 Encounters:  02/08/19 157 lb 6.4 oz (71.4 kg)  08/10/18 144 lb (65.3 kg)  03/21/18 144 lb (65.3 kg)     Physical Exam Vitals signs and nursing note reviewed.  Constitutional:      General: She is not in acute distress.    Appearance: She is well-developed.  HENT:     Head: Normocephalic and atraumatic.  Eyes:     Conjunctiva/sclera: Conjunctivae normal.     Pupils: Pupils are equal, round, and reactive to light.  Neck:     Musculoskeletal: Normal range of motion.  Cardiovascular:     Rate and Rhythm: Normal rate and regular rhythm.     Heart sounds: Normal heart sounds.  Pulmonary:      Effort: Pulmonary effort is normal. No respiratory distress.     Breath sounds: Normal breath sounds.  Musculoskeletal: Normal range of motion.  Skin:    General: Skin is warm and dry.  Neurological:     Mental Status: She is alert and oriented to person, place, and time.  Psychiatric:        Mood and Affect: Mood normal.        Behavior: Behavior normal.        Thought Content: Thought content normal.        Judgment: Judgment normal.     ASSESSMENT/PLAN:   1. Hyperlipidemia LDL goal <100 - Comprehensive metabolic panel - CBC with Differential - Lipid Panel  2. Rheumatoid arthritis involving multiple sites, unspecified rheumatoid factor presence ([place]) Patient is being followed by a specialist for this condition. Patient advised to continue with follow up appointments. PCP will continue to monitor progress.    3. Prediabetes Increased to 6.3. We discussed diet and exercise. Patient will make changes and repeat in 6 months.  - Glucose (CBG) - HgB A1c    Return in about 6 months (around 08/11/2019) for htn.    The patient was given clear instructions to go to ER or return to medical center if symptoms do not improve, worsen or new problems develop. The patient verbalized understanding and agreed with  plan of care.   Ms. Doug Sou. Nathaneil Canary, FNP-BC [facility] [address] , [place] [address] [phone]

## 2019-02-08 NOTE — Patient Instructions (Signed)
Hand Washing Germs such as bacteria, viruses, and parasites are found everywhere. They can be in the air and water. They can also be on surfaces like food, door handles, and your skin. Every day, your hands touch germs. Many of these germs can make you and your family sick. Washing your hands is one of the best ways to lower your risk of getting and sharing germs. When should I wash my hands? You should wash your hands whenever you think they are dirty. You should also wash your hands:  Before: ? Visiting a baby or anyone with a weakened disease-fighting system (immunesystem). ? Putting in and taking out contact lenses.  After: ? Using the bathroom or helping someone else use the bathroom. ? Working or playing outside. ? Touching or taking out the garbage. ? Touching anything dirty around your home. ? Sneezing, coughing, or blowing your nose. ? Using a phone, including your mobile phone. ? Touching an animal, animal food, animal poop, or its toys or leash. ? Touching money. ? Using household cleaners or poisonous chemicals. ? Handling dirty clothes, bedding, or rags. ? Using public transportation. ? Going shopping, especially if you use a shopping cart or basket. ? Shaking hands. ? Handling livestock, such as cows or sheep.  Before and after: ? Preparing food. ? Eating. ? Visiting or taking care of someone who is sick. This includes touching used tissues, toys, and clothes. ? Changing a bandage (dressing). ? Taking care of an injury or wound. ? Giving or taking medicine. ? Preparing a bottle for a baby. ? Feeding a baby or young child. ? Changing a diaper. What is the right way to wash my hands?  1. Wet your hands with clean, running water. Turn off the water or move your hands out of the running water. 2. Apply liquid soap or bar soap to your hands. 3. Rub your hands together quickly to create lather. 4. Keep rubbing your hands together for at least 20 seconds. Thoroughly  scrub all parts of your hands. This includes scrubbing under your fingernails and between your fingers. 5. Rinse your hands with clean, running water. Do this until all the soap is gone. 6. Dry your hands using an air dryer or a clean paper or cloth towel, or let your hands air-dry. Do not use your clothing or a dirty towel to dry your hands. If you are in a public restroom, use your towel:  To turn off the water faucet.  To open the bathroom door. How can I clean my hands if I do not have soap and water?  If soap and clean water are not available, use a hand-washing wipe, spray, or gel (hand sanitizer). Use one that contains at least 60% alcohol. If you are handling food, gels are not recommended as a replacement for hand washing with soap and water. To use these products, follow the directions on the product, and:  Apply enough product to cover your hands.  Make sure you wipe, rub, or spray the product so that it reaches every part of your hands and wrists. Include the backs of your hands, between your fingers, and under your fingernails.  Rub the product onto your hands until it dries. Summary  Every day, your hands touch germs. Many of these germs can make you and your family sick.  Washing your hands is one of the best ways to lower your risk of getting and sharing germs.  If soap and clean water are not available,   use a hand-washing wipe, spray, or gel. This information is not intended to replace advice given to you by your health care provider. Make sure you discuss any questions you have with your health care provider. Document Released: 10/20/2008 Document Revised: 08/16/2017 Document Reviewed: 08/16/2017 Elsevier Interactive Patient Education  2019 [employer] Inc.  

## 2019-02-09 LAB — COMPREHENSIVE METABOLIC PANEL
ALT: 14 IU/L (ref 0–32)
AST: 19 IU/L (ref 0–40)
Albumin/Globulin Ratio: 1.3 (ref 1.2–2.2)
Albumin: 4.2 g/dL (ref 3.8–4.8)
Alkaline Phosphatase: 77 IU/L (ref 39–117)
BUN/Creatinine Ratio: 16 (ref 12–28)
BUN: 12 mg/dL (ref 8–27)
Bilirubin Total: 0.3 mg/dL (ref 0.0–1.2)
CO2: 23 mmol/L (ref 20–29)
Calcium: 9.3 mg/dL (ref 8.7–10.3)
Chloride: 102 mmol/L (ref 96–106)
Creatinine, Ser: 0.76 mg/dL (ref 0.57–1.00)
GFR calc Af Amer: 93 mL/min/{1.73_m2} (ref >59)
GFR calc non Af Amer: 81 mL/min/{1.73_m2} (ref >59)
Globulin, Total: 3.3 g/dL (ref 1.5–4.5)
Glucose: 88 mg/dL (ref 65–99)
Potassium: 3.9 mmol/L (ref 3.5–5.2)
Sodium: 141 mmol/L (ref 134–144)
Total Protein: 7.5 g/dL (ref 6.0–8.5)

## 2019-02-09 LAB — CBC WITH DIFFERENTIAL/PLATELET
Basophils Absolute: 0 10*3/uL (ref 0.0–0.2)
Basos: 0 %
EOS (ABSOLUTE): 0.1 10*3/uL (ref 0.0–0.4)
Eos: 1 %
Hematocrit: 38.1 % (ref 34.0–46.6)
Hemoglobin: 13.2 g/dL (ref 11.1–15.9)
Immature Grans (Abs): 0 10*3/uL (ref 0.0–0.1)
Immature Granulocytes: 0 %
Lymphocytes Absolute: 1.7 10*3/uL (ref 0.7–3.1)
Lymphs: 31 %
MCH: 31.7 pg (ref 26.6–33.0)
MCHC: 34.6 g/dL (ref 31.5–35.7)
MCV: 91 fL (ref 79–97)
Monocytes Absolute: 0.3 10*3/uL (ref 0.1–0.9)
Monocytes: 5 %
Neutrophils Absolute: 3.3 10*3/uL (ref 1.4–7.0)
Neutrophils: 63 %
Platelets: 201 10*3/uL (ref 150–450)
RBC: 4.17 x10E6/uL (ref 3.77–5.28)
RDW: 13.2 % (ref 11.7–15.4)
WBC: 5.4 10*3/uL (ref 3.4–10.8)

## 2019-02-09 LAB — LIPID PANEL
Chol/HDL Ratio: 2.6 ratio (ref 0.0–4.4)
Cholesterol, Total: 199 mg/dL (ref 100–199)
HDL: 77 mg/dL (ref >39)
LDL Calculated: 105 mg/dL — ABNORMAL HIGH (ref 0–99)
Triglycerides: 86 mg/dL (ref 0–149)
VLDL Cholesterol Cal: 17 mg/dL (ref 5–40)

## 2019-03-04 ENCOUNTER — Telehealth: Payer: Self-pay

## 2019-03-04 NOTE — Telephone Encounter (Signed)
Patient would like return call from provider

## 2019-03-06 NOTE — Telephone Encounter (Signed)
Spoke with patient and she stated this was taken care of through [facility]

## 2019-07-21 ENCOUNTER — Encounter: Payer: Self-pay | Admitting: Family Medicine

## 2019-07-31 ENCOUNTER — Encounter (HOSPITAL_COMMUNITY): Payer: Self-pay

## 2019-07-31 ENCOUNTER — Encounter (HOSPITAL_COMMUNITY): Payer: Self-pay | Admitting: *Deleted

## 2019-08-12 ENCOUNTER — Other Ambulatory Visit: Payer: Self-pay

## 2019-08-12 ENCOUNTER — Encounter: Payer: Self-pay | Admitting: Family Medicine

## 2019-08-12 ENCOUNTER — Ambulatory Visit (INDEPENDENT_AMBULATORY_CARE_PROVIDER_SITE_OTHER): Payer: Medicare Other | Admitting: Family Medicine

## 2019-08-12 VITALS — BP 116/68 | HR 86 | Temp 98.4°F | Resp 16 | Ht 66.0 in | Wt 158.0 lb

## 2019-08-12 DIAGNOSIS — M069 Rheumatoid arthritis, unspecified: Secondary | ICD-10-CM

## 2019-08-12 DIAGNOSIS — E785 Hyperlipidemia, unspecified: Secondary | ICD-10-CM

## 2019-08-12 DIAGNOSIS — Z09 Encounter for follow-up examination after completed treatment for conditions other than malignant neoplasm: Secondary | ICD-10-CM

## 2019-08-12 DIAGNOSIS — M62838 Other muscle spasm: Secondary | ICD-10-CM | POA: Diagnosis not present

## 2019-08-12 DIAGNOSIS — R7303 Prediabetes: Secondary | ICD-10-CM | POA: Diagnosis not present

## 2019-08-12 LAB — POCT GLYCOSYLATED HEMOGLOBIN (HGB A1C): Hemoglobin A1C: 5.6 % (ref 4.0–5.6)

## 2019-08-12 NOTE — Progress Notes (Signed)
Patient [facility]   Established Patient Office Visit  Subjective:  Patient ID: Lorraine Blair, female    DOB: [birth year]  Age: 68 y.o. MRN: [id number]  CC:  Chief Complaint  Patient presents with  . Follow-up    prediabetes   . Hyperlipidemia  . Rheumatoid Arthritis    reduced range of motion in left shoulder   . Spasms    in tops of legs     HPI Lorraine Blair is a 68 year old female who presents for Follow Up today.   Past Medical History:  Diagnosis Date  . Allergic rhinitis due to pollen   . Carpal tunnel syndrome   . Neuromuscular disorder ([place])   . Pure hypercholesterolemia    Current Status: Lorraine Blair is a former patient of Lorraine Boast, NP. Since her last office visit, she is doing well with no complaints. She has not been monitoring her blood glucose levels lately. She denies fatigue, frequent urination, blurred vision, excessive hunger, excessive thirst, weight gain, weight loss, and poor wound healing. She continues to check her feet regularly. She continues to see Rheumatologist as needed. She is currently scheduled for bone density test on tomorrow.   She denies fevers, chills, recent infections, weight loss, and night sweats. She has not had any headaches, dizziness, and falls. No chest pain, heart palpitations, cough and shortness of breath reported. No reports of GI problems such as nausea, vomiting, diarrhea, and constipation. She has no reports of blood in stools, dysuria and hematuria. No depression or anxiety reported. She denies pain today.   Past Surgical History:  Procedure Laterality Date  . TUBAL LIGATION  1989    Family History  Problem Relation Age of Onset  . Cancer Father   . Alzheimer's disease Father   . Rheum arthritis Sister   . Colon cancer Neg Hx     Social History   Socioeconomic History  . Marital status: Single    Spouse name: Not on file  . Number of children: Not on file   . Years of education: Not on file  . Highest education level: Not on file  Occupational History  . Not on file  Social Needs  . Financial resource strain: Not on file  . Food insecurity    Worry: Not on file    Inability: Not on file  . Transportation needs    Medical: Not on file    Non-medical: Not on file  Tobacco Use  . Smoking status: Former Smoker    Types: Cigarettes    Quit date: 06/24/1988    Years since quitting: 31.1  . Smokeless tobacco: Never Used  Substance and Sexual Activity  . Alcohol use: No    Alcohol/week: 1.0 standard drinks    Types: 1 Glasses of wine per week  . Drug use: No  . Sexual activity: Never  Lifestyle  . Physical activity    Days per week: Not on file    Minutes per session: Not on file  . Stress: Not on file  Relationships  . Social Herbalist on phone: Not on file    Gets together: Not on file    Attends religious service: Not on file    Active member of club or organization: Not on file    Attends meetings of clubs or organizations: Not on file    Relationship status: Not on file  . Intimate partner violence  Fear of current or ex partner: Not on file    Emotionally abused: Not on file    Physically abused: Not on file    Forced sexual activity: Not on file  Other Topics Concern  . Not on file  Social History Narrative  . Not on file    Outpatient Medications Prior to Visit  Medication Sig Dispense Refill  . cholecalciferol (VITAMIN D) 1000 units tablet Take 1,000 Units by mouth daily.    . Cyanocobalamin (B-12 PO) Take 1 tablet by mouth daily.    . cycloSPORINE (RESTASIS) 0.05 % ophthalmic emulsion 1 drop 2 (two) times daily.    Marland Kitchen inFLIXimab (REMICADE) 100 MG injection Inject into the vein.    . predniSONE (DELTASONE) 10 MG tablet Take 4 tablets by mouth daily.   0  . TURMERIC PO Take 1 capsule by mouth daily.     No facility-administered medications prior to visit.     No Known Allergies  ROS Review of  Systems  Constitutional: Negative.   HENT: Negative.   Eyes: Negative.   Respiratory: Negative.   Cardiovascular: Negative.   Gastrointestinal: Negative.   Endocrine: Negative.   Genitourinary: Negative.   Musculoskeletal:       Lower extremity muscle spasms  Skin: Negative.   Allergic/Immunologic: Negative.   Neurological: Negative.   Hematological: Negative.   Psychiatric/Behavioral: Negative.       Objective:    Physical Exam  Constitutional: She is oriented to person, place, and time. She appears well-developed and well-nourished.  HENT:  Head: Normocephalic and atraumatic.  Eyes: Conjunctivae are normal.  Neck: Normal range of motion. Neck supple.  Cardiovascular: Normal rate, regular rhythm, normal heart sounds and intact distal pulses.  Pulmonary/Chest: Effort normal and breath sounds normal.  Abdominal: Soft. Bowel sounds are normal.  Musculoskeletal: Normal range of motion.  Neurological: She is alert and oriented to person, place, and time. She has normal reflexes.  Skin: Skin is warm and dry.  Psychiatric: She has a normal mood and affect. Her behavior is normal. Judgment and thought content normal.  Nursing note and vitals reviewed.   BP 116/68 (BP Location: Left Arm, Patient Position: Sitting, Cuff Size: Normal)   Pulse 86   Temp 98.4 F (36.9 C) (Oral)   Resp 16   Ht 5\' 6"  (1.676 m)   Wt 158 lb (71.7 kg)   SpO2 98%   BMI 25.50 kg/m  Wt Readings from Last 3 Encounters:  08/12/19 158 lb (71.7 kg)  02/08/19 157 lb 6.4 oz (71.4 kg)  08/10/18 144 lb (65.3 kg)     Health Maintenance Due  Topic Date Due  . Hepatitis C Screening  1951-03-31  . DEXA SCAN  01/26/2016  . MAMMOGRAM  05/18/2018  . PNA vac Low Risk Adult (2 of 2 - PPSV23) 07/18/2019    There are no preventive care reminders to display for this patient.  Lab Results  Component Value Date   TSH 2.163 10/27/2015   Lab Results  Component Value Date   WBC 5.4 02/08/2019   HGB 13.2  02/08/2019   HCT 38.1 02/08/2019   MCV 91 02/08/2019   PLT 201 02/08/2019   Lab Results  Component Value Date   NA 141 02/08/2019   K 3.9 02/08/2019   CO2 23 02/08/2019   GLUCOSE 88 02/08/2019   BUN 12 02/08/2019   CREATININE 0.76 02/08/2019   BILITOT 0.3 02/08/2019   ALKPHOS 77 02/08/2019   AST 19 02/08/2019  ALT 14 02/08/2019   PROT 7.5 02/08/2019   ALBUMIN 4.2 02/08/2019   CALCIUM 9.3 02/08/2019   Lab Results  Component Value Date   CHOL 199 02/08/2019   Lab Results  Component Value Date   HDL 77 02/08/2019   Lab Results  Component Value Date   LDLCALC 105 (H) 02/08/2019   Lab Results  Component Value Date   TRIG 86 02/08/2019   Lab Results  Component Value Date   CHOLHDL 2.6 02/08/2019   Lab Results  Component Value Date   HGBA1C 5.6 08/12/2019      Assessment & Plan:   1. Rheumatoid arthritis involving multiple sites, unspecified rheumatoid factor presence ([place]) Continue to follow up with Rheumatologist as needed.   2. Hyperlipidemia LDL goal <100  3. Muscle spasms of both lower extremities We will assess potassium level today.   4. Muscle spasm - Comprehensive metabolic panel  5. Prediabetes Hgb A1c is stable at 5.6 today. She will continue to decrease foods/beverages high in sugars and carbs and follow Heart Healthy or DASH diet. Increase physical activity to at least 30 minutes cardio exercise daily.  - HgB A1c  6. Follow up She will follow up in 6 months.   No orders of the defined types were placed in this encounter.   Orders Placed This Encounter  Procedures  . Comprehensive metabolic panel  . HgB A1c    Referral Orders  No referral(s) requested today     Kathe Becton,  MSN, FNP-BC [facility] [place], [place] [address] [phone]- fax   Problem List Items Addressed This Visit      Musculoskeletal and Integument    Rheumatoid arthritis ([place]) - Primary     Other   Hyperlipidemia LDL goal <100   Prediabetes   Relevant Orders   HgB A1c (Completed)    Other Visit Diagnoses    Muscle spasms of both lower extremities       Muscle spasm       Relevant Orders   Comprehensive metabolic panel   Follow up          No orders of the defined types were placed in this encounter.   Follow-up: Return in about 6 months (around 02/09/2020).    Azzie Glatter, FNP

## 2019-08-13 LAB — COMPREHENSIVE METABOLIC PANEL
ALT: 13 IU/L (ref 0–32)
AST: 22 IU/L (ref 0–40)
Albumin/Globulin Ratio: 1 — ABNORMAL LOW (ref 1.2–2.2)
Albumin: 4 g/dL (ref 3.8–4.8)
Alkaline Phosphatase: 112 IU/L (ref 39–117)
BUN/Creatinine Ratio: 19 (ref 12–28)
BUN: 15 mg/dL (ref 8–27)
Bilirubin Total: 0.2 mg/dL (ref 0.0–1.2)
CO2: 24 mmol/L (ref 20–29)
Calcium: 9.3 mg/dL (ref 8.7–10.3)
Chloride: 103 mmol/L (ref 96–106)
Creatinine, Ser: 0.79 mg/dL (ref 0.57–1.00)
GFR calc Af Amer: 89 mL/min/{1.73_m2} (ref >59)
GFR calc non Af Amer: 77 mL/min/{1.73_m2} (ref >59)
Globulin, Total: 3.9 g/dL (ref 1.5–4.5)
Glucose: 84 mg/dL (ref 65–99)
Potassium: 4 mmol/L (ref 3.5–5.2)
Sodium: 140 mmol/L (ref 134–144)
Total Protein: 7.9 g/dL (ref 6.0–8.5)

## 2019-10-11 DIAGNOSIS — M7502 Adhesive capsulitis of left shoulder: Secondary | ICD-10-CM | POA: Insufficient documentation

## 2019-10-23 ENCOUNTER — Encounter: Payer: Self-pay | Admitting: Internal Medicine

## 2019-11-19 ENCOUNTER — Other Ambulatory Visit: Payer: Self-pay

## 2019-11-19 ENCOUNTER — Ambulatory Visit: Payer: Medicare Other | Attending: Internal Medicine

## 2019-11-19 ENCOUNTER — Ambulatory Visit (INDEPENDENT_AMBULATORY_CARE_PROVIDER_SITE_OTHER): Payer: Medicare Other | Admitting: Family Medicine

## 2019-11-19 VITALS — Ht 66.0 in | Wt 155.8 lb

## 2019-11-19 DIAGNOSIS — Z09 Encounter for follow-up examination after completed treatment for conditions other than malignant neoplasm: Secondary | ICD-10-CM

## 2019-11-19 DIAGNOSIS — Z20822 Contact with and (suspected) exposure to covid-19: Secondary | ICD-10-CM

## 2019-11-19 DIAGNOSIS — Z0000 Encounter for general adult medical examination without abnormal findings: Secondary | ICD-10-CM

## 2019-11-19 LAB — POCT URINALYSIS DIPSTICK
Blood, UA: NEGATIVE
Glucose, UA: NEGATIVE
Ketones, UA: NEGATIVE
Leukocytes, UA: NEGATIVE
Nitrite, UA: NEGATIVE
Protein, UA: NEGATIVE
Spec Grav, UA: 1.025 (ref 1.010–1.025)
Urobilinogen, UA: 1 E.U./dL
pH, UA: 5.5 (ref 5.0–8.0)

## 2019-11-20 LAB — NOVEL CORONAVIRUS, NAA: SARS-CoV-2, NAA: NOT DETECTED

## 2019-11-25 ENCOUNTER — Encounter: Payer: Self-pay | Admitting: Family Medicine

## 2019-11-25 ENCOUNTER — Ambulatory Visit (INDEPENDENT_AMBULATORY_CARE_PROVIDER_SITE_OTHER): Payer: Medicare PPO | Admitting: Family Medicine

## 2019-11-25 ENCOUNTER — Other Ambulatory Visit: Payer: Self-pay

## 2019-11-25 VITALS — BP 133/79 | HR 91 | Temp 98.3°F | Ht 66.0 in | Wt 157.0 lb

## 2019-11-25 DIAGNOSIS — R05 Cough: Secondary | ICD-10-CM | POA: Diagnosis not present

## 2019-11-25 DIAGNOSIS — Z09 Encounter for follow-up examination after completed treatment for conditions other than malignant neoplasm: Secondary | ICD-10-CM

## 2019-11-25 DIAGNOSIS — R7303 Prediabetes: Secondary | ICD-10-CM

## 2019-11-25 DIAGNOSIS — M0579 Rheumatoid arthritis with rheumatoid factor of multiple sites without organ or systems involvement: Secondary | ICD-10-CM

## 2019-11-25 DIAGNOSIS — J029 Acute pharyngitis, unspecified: Secondary | ICD-10-CM | POA: Insufficient documentation

## 2019-11-25 DIAGNOSIS — R059 Cough, unspecified: Secondary | ICD-10-CM

## 2019-11-25 DIAGNOSIS — R21 Rash and other nonspecific skin eruption: Secondary | ICD-10-CM | POA: Insufficient documentation

## 2019-11-25 LAB — POCT RAPID STREP A (OFFICE): Rapid Strep A Screen: NEGATIVE

## 2019-11-25 MED ORDER — BENZONATATE 100 MG PO CAPS: 100.0000 mg | ORAL_CAPSULE | Freq: Two times a day (BID) | ORAL | 0 refills | Status: DC | PRN

## 2019-11-25 MED ORDER — AMOXICILLIN-POT CLAVULANATE 875-125 MG PO TABS: 1.0000 | ORAL_TABLET | Freq: Two times a day (BID) | ORAL | 0 refills | Status: AC

## 2019-11-25 NOTE — Progress Notes (Signed)
Patient [facility]   Established Patient Office Visit  Subjective:  Patient ID: Lorraine Blair, female    DOB: 1951-07-22  Age: 69 y.o. MRN: [id number]  CC:  Chief Complaint  Patient presents with  . Sore Throat  . Advice Only    Medication review    HPI Lorraine Blair is a 69 year old female who presents for Follow Up today.   Past Medical History:  Diagnosis Date  . Allergic rhinitis due to pollen   . Carpal tunnel syndrome   . Neuromuscular disorder ([place])   . Pure hypercholesterolemia   . Rheumatoid arthritis ([place])    Current Status: Since her last office visit, she has c/o throat discomfort, intermittent soreness, and laryngitis/hoarness X 6 weeks. She has been taking cough syrup and drinking Green Tea with Tumeric. She continues to have chronic pain r/t Rheumatology Arthritis. She recently had follow up with Rheumatologist 09/2019. Her next appointment with Dr. Irene Pap is scheduled for next month. She states that she has been having skin rash X 6 months ago. She was previously on Remicade, and began to get increased dosing of Remicade, because of increased RA flare ups. She is currently on Fluconazole Cream, which she states is not effective. She denies fevers, chills, fatigue, recent infections, weight loss, and night sweats. She has not had any headaches, visual changes, dizziness, and falls. No chest pain, heart palpitations, cough and shortness of breath reported. No reports of GI problems such as nausea, vomiting, diarrhea, and constipation. She has no reports of blood in stools, dysuria and hematuria. No depression or anxiety reported today. She denies suicidal ideations, homicidal ideations, or auditory hallucinations.   Past Surgical History:  Procedure Laterality Date  . TUBAL LIGATION  1989    Family History  Problem Relation Age of Onset  . Cancer Father   . Alzheimer's disease Father   . Rheum arthritis Sister   .  Colon cancer Neg Hx     Social History   Socioeconomic History  . Marital status: Single    Spouse name: Not on file  . Number of children: Not on file  . Years of education: Not on file  . Highest education level: Not on file  Occupational History  . Not on file  Tobacco Use  . Smoking status: Former Smoker    Types: Cigarettes    Quit date: 06/24/1988    Years since quitting: 31.4  . Smokeless tobacco: Never Used  Substance and Sexual Activity  . Alcohol use: No    Alcohol/week: 1.0 standard drinks    Types: 1 Glasses of wine per week  . Drug use: No  . Sexual activity: Never  Other Topics Concern  . Not on file  Social History Narrative  . Not on file   Social Determinants of Health   Financial Resource Strain:   . Difficulty of Paying Living Expenses: Not on file  Food Insecurity:   . Worried About Charity fundraiser in the Last Year: Not on file  . Ran Out of Food in the Last Year: Not on file  Transportation Needs:   . Lack of Transportation (Medical): Not on file  . Lack of Transportation (Non-Medical): Not on file  Physical Activity:   . Days of Exercise per Week: Not on file  . Minutes of Exercise per Session: Not on file  Stress:   . Feeling of Stress : Not on file  Social Connections:   .  Frequency of Communication with Friends and Family: Not on file  . Frequency of Social Gatherings with Friends and Family: Not on file  . Attends Religious Services: Not on file  . Active Member of Clubs or Organizations: Not on file  . Attends Archivist Meetings: Not on file  . Marital Status: Not on file  Intimate Partner Violence:   . Fear of Current or Ex-Partner: Not on file  . Emotionally Abused: Not on file  . Physically Abused: Not on file  . Sexually Abused: Not on file    Outpatient Medications Prior to Visit  Medication Sig Dispense Refill  . cholecalciferol (VITAMIN D) 1000 units tablet Take 1,000 Units by mouth daily.    . Cyanocobalamin  (B-12 PO) Take 1 tablet by mouth daily.    . cycloSPORINE (RESTASIS) 0.05 % ophthalmic emulsion 1 drop 2 (two) times daily.    Marland Kitchen inFLIXimab (REMICADE) 100 MG injection Inject into the vein.    . predniSONE (DELTASONE) 10 MG tablet Take 4 tablets by mouth daily.   0  . TURMERIC PO Take 1 capsule by mouth daily.     No facility-administered medications prior to visit.    No Known Allergies  ROS Review of Systems  Constitutional: Negative.   HENT: Positive for sore throat.   Eyes: Negative.   Respiratory: Negative.   Cardiovascular: Negative.   Gastrointestinal: Negative.   Endocrine: Negative.   Genitourinary: Negative.   Musculoskeletal: Positive for arthralgias (generalized pain r/t RA).  Skin: Positive for rash.  Allergic/Immunologic: Negative.   Neurological: Negative.   Hematological: Negative.   Psychiatric/Behavioral: Negative.       Objective:    Physical Exam  Constitutional: She is oriented to person, place, and time. She appears well-developed and well-nourished.  HENT:  Head: Normocephalic and atraumatic.  Eyes: Conjunctivae are normal.  Cardiovascular: Normal rate, regular rhythm, normal heart sounds and intact distal pulses.  Pulmonary/Chest: Effort normal and breath sounds normal.  Abdominal: Soft. Bowel sounds are normal.  Musculoskeletal:        General: Normal range of motion.     Cervical back: Normal range of motion and neck supple.  Neurological: She is alert and oriented to person, place, and time. She has normal reflexes.  Skin: Skin is warm and dry. Rash (over entire extremity) noted.  Psychiatric: She has a normal mood and affect. Her behavior is normal. Judgment and thought content normal.  Nursing note and vitals reviewed.  BP 133/79   Pulse 91   Temp 98.3 F (36.8 C)   Ht 5\' 6"  (1.676 m)   Wt 157 lb (71.2 kg)   SpO2 97%   BMI 25.34 kg/m  Wt Readings from Last 3 Encounters:  11/25/19 157 lb (71.2 kg)  11/19/19 155 lb 12.8 oz (70.7  kg)  08/12/19 158 lb (71.7 kg)     Health Maintenance Due  Topic Date Due  . Hepatitis C Screening  [birth year]  . DEXA SCAN  01/26/2016  . MAMMOGRAM  05/18/2018  . PNA vac Low Risk Adult (2 of 2 - PPSV23) 07/18/2019  . COLONOSCOPY  09/23/2019    There are no preventive care reminders to display for this patient.  Lab Results  Component Value Date   TSH 2.163 10/27/2015   Lab Results  Component Value Date   WBC 5.4 02/08/2019   HGB 13.2 02/08/2019   HCT 38.1 02/08/2019   MCV 91 02/08/2019   PLT 201 02/08/2019   Lab Results  Component Value Date   NA 140 08/12/2019   K 4.0 08/12/2019   CO2 24 08/12/2019   GLUCOSE 84 08/12/2019   BUN 15 08/12/2019   CREATININE 0.79 08/12/2019   BILITOT 0.2 08/12/2019   ALKPHOS 112 08/12/2019   AST 22 08/12/2019   ALT 13 08/12/2019   PROT 7.9 08/12/2019   ALBUMIN 4.0 08/12/2019   CALCIUM 9.3 08/12/2019   Lab Results  Component Value Date   CHOL 199 02/08/2019   Lab Results  Component Value Date   HDL 77 02/08/2019   Lab Results  Component Value Date   LDLCALC 105 (H) 02/08/2019   Lab Results  Component Value Date   TRIG 86 02/08/2019   Lab Results  Component Value Date   CHOLHDL 2.6 02/08/2019   Lab Results  Component Value Date   HGBA1C 5.6 08/12/2019      Assessment & Plan:   1. Throat infection We will initiate antibiotic today.  - amoxicillin-clavulanate (AUGMENTIN) 875-125 MG tablet; Take 1 tablet by mouth 2 (two) times daily for 10 days.  Dispense: 20 tablet; Refill: 0 - benzonatate (TESSALON) 100 MG capsule; Take 1 capsule (100 mg total) by mouth 2 (two) times daily as needed for cough.  Dispense: 20 capsule; Refill: 0  2. Sore throat Negative for strep. She is encouraged to gargle with warm salt water, drink warm, soothing liquids, and use throat lozenges to aide in throat discomfort.  - Rapid Strep A - amoxicillin-clavulanate (AUGMENTIN) 875-125 MG tablet; Take 1 tablet by mouth 2 (two) times daily  for 10 days.  Dispense: 20 tablet; Refill: 0 - benzonatate (TESSALON) 100 MG capsule; Take 1 capsule (100 mg total) by mouth 2 (two) times daily as needed for cough.  Dispense: 20 capsule; Refill: 0  3. Cough - amoxicillin-clavulanate (AUGMENTIN) 875-125 MG tablet; Take 1 tablet by mouth 2 (two) times daily for 10 days.  Dispense: 20 tablet; Refill: 0 - benzonatate (TESSALON) 100 MG capsule; Take 1 capsule (100 mg total) by mouth 2 (two) times daily as needed for cough.  Dispense: 20 capsule; Refill: 0  4. Prediabetes Stable.  - Urinalysis Dipstick  5. Skin rash - Ambulatory referral to Dermatology  6. Rheumatoid arthritis involving multiple sites with positive rheumatoid factor ([facility])  7. Follow up She will keep follow up appointment as scheduled for 01/2020.  Meds ordered this encounter  Medications  . amoxicillin-clavulanate (AUGMENTIN) 875-125 MG tablet    Sig: Take 1 tablet by mouth 2 (two) times daily for 10 days.    Dispense:  20 tablet    Refill:  0  . benzonatate (TESSALON) 100 MG capsule    Sig: Take 1 capsule (100 mg total) by mouth 2 (two) times daily as needed for cough.    Dispense:  20 capsule    Refill:  0    Orders Placed This Encounter  Procedures  . Ambulatory referral to Dermatology  . Rapid Strep A  . Urinalysis Dipstick     Referral Orders     Ambulatory referral to Dermatology   Kathe Becton,  MSN, FNP-BC [facility] [place], [place] [address] [phone]- fax  Problem List Items Addressed This Visit      Musculoskeletal and Integument   Rheumatoid arthritis ([place])     Other   Prediabetes   Relevant Orders   Urinalysis Dipstick    Other Visit Diagnoses    Throat infection    -  Primary   Relevant Medications   amoxicillin-clavulanate (AUGMENTIN) 875-125 MG tablet   benzonatate (TESSALON) 100 MG capsule   Sore throat       Relevant  Medications   amoxicillin-clavulanate (AUGMENTIN) 875-125 MG tablet   benzonatate (TESSALON) 100 MG capsule   Other Relevant Orders   Rapid Strep A   Cough       Relevant Medications   amoxicillin-clavulanate (AUGMENTIN) 875-125 MG tablet   benzonatate (TESSALON) 100 MG capsule   Skin rash       Relevant Orders   Ambulatory referral to Dermatology   Follow up          Meds ordered this encounter  Medications  . amoxicillin-clavulanate (AUGMENTIN) 875-125 MG tablet    Sig: Take 1 tablet by mouth 2 (two) times daily for 10 days.    Dispense:  20 tablet    Refill:  0  . benzonatate (TESSALON) 100 MG capsule    Sig: Take 1 capsule (100 mg total) by mouth 2 (two) times daily as needed for cough.    Dispense:  20 capsule    Refill:  0    Follow-up: Return in about 3 months (around 02/23/2020).    Azzie Glatter, FNP

## 2019-11-25 NOTE — Patient Instructions (Signed)
Sore Throat When you have a sore throat, your throat may feel:  Tender.  Burning.  Irritated.  Scratchy.  Painful when you swallow.  Painful when you talk. Many things can cause a sore throat, such as:  An infection.  Allergies.  Dry air.  Smoke or pollution.  Radiation treatment.  Gastroesophageal reflux disease (GERD).  A tumor. A sore throat can be the first sign of another sickness. It can happen with other problems, like:  Coughing.  Sneezing.  Fever.  Swelling in the neck. Most sore throats go away without treatment. Follow these instructions at home:      Take over-the-counter medicines only as told by your doctor. ? If your child has a sore throat, do not give your child aspirin.  Drink enough fluids to keep your pee (urine) pale yellow.  Rest when you feel you need to.  To help with pain: ? Sip warm liquids, such as broth, herbal tea, or warm water. ? Eat or drink cold or frozen liquids, such as frozen ice pops. ? Gargle with a salt-water mixture 3-4 times a day or as needed. To make a salt-water mixture, add -1 tsp (3-6 g) of salt to 1 cup (237 mL) of warm water. Mix it until you cannot see the salt anymore. ? Suck on hard candy or throat lozenges. ? Put a cool-mist humidifier in your bedroom at night. ? Sit in the bathroom with the door closed for 5-10 minutes while you run hot water in the shower.  Do not use any products that contain nicotine or tobacco, such as cigarettes, e-cigarettes, and chewing tobacco. If you need help quitting, ask your doctor.  Wash your hands well and often with soap and water. If soap and water are not available, use hand sanitizer. Contact a doctor if:  You have a fever for more than 2-3 days.  You keep having symptoms for more than 2-3 days.  Your throat does not get better in 7 days.  You have a fever and your symptoms suddenly get worse.  Your child who is 3 months to 64 years old has a temperature of  102.23F (39C) or higher. Get help right away if:  You have trouble breathing.  You cannot swallow fluids, soft foods, or your saliva.  You have swelling in your throat or neck that gets worse.  You keep feeling sick to your stomach (nauseous).  You keep throwing up (vomiting). Summary  A sore throat is pain, burning, irritation, or scratchiness in the throat. Many things can cause a sore throat.  Take over-the-counter medicines only as told by your doctor. Do not give your child aspirin.  Drink plenty of fluids, and rest as needed.  Contact a doctor if your symptoms get worse or your sore throat does not get better within 7 days. This information is not intended to replace advice given to you by your health care provider. Make sure you discuss any questions you have with your health care provider. Document Revised: 04/09/2018 Document Reviewed: 04/09/2018 Elsevier Patient Education  2020 [place] capsules What is this medicine? BENZONATATE (ben ZOE na tate) is used to treat cough. This medicine may be used for other purposes; ask your health care provider or pharmacist if you have questions. COMMON BRAND NAME(S): Tessalon Perles, Zonatuss What should I tell my health care provider before I take this medicine? They need to know if you have any of these conditions:  kidney or liver disease  an unusual  or allergic reaction to benzonatate, anesthetics, other medicines, foods, dyes, or preservatives  pregnant or trying to get pregnant  breast-feeding How should I use this medicine? Take this medicine by mouth with a glass of water. Follow the directions on the prescription label. Avoid breaking, chewing, or sucking the capsule, as this can cause serious side effects. Take your medicine at regular intervals. Do not take your medicine more often than directed. Talk to your pediatrician regarding the use of this medicine in children. While this drug may be  prescribed for children as young as 4 years old for selected conditions, precautions do apply. Overdosage: If you think you have taken too much of this medicine contact a poison control center or emergency room at once. NOTE: This medicine is only for you. Do not share this medicine with others. What if I miss a dose? If you miss a dose, take it as soon as you can. If it is almost time for your next dose, take only that dose. Do not take double or extra doses. What may interact with this medicine? Do not take this medicine with any of the following medications:  MAOIs like Carbex, Eldepryl, Marplan, Nardil, and Parnate This list may not describe all possible interactions. Give your health care provider a list of all the medicines, herbs, non-prescription drugs, or dietary supplements you use. Also tell them if you smoke, drink alcohol, or use illegal drugs. Some items may interact with your medicine. What should I watch for while using this medicine? Tell your doctor if your symptoms do not improve or if they get worse. If you have a high fever, skin rash, or headache, see your health care professional. You may get drowsy or dizzy. Do not drive, use machinery, or do anything that needs mental alertness until you know how this medicine affects you. Do not sit or stand up quickly, especially if you are an older patient. This reduces the risk of dizzy or fainting spells. What side effects may I notice from receiving this medicine? Side effects that you should report to your doctor or health care professional as soon as possible:  allergic reactions like skin rash, itching or hives, swelling of the face, lips, or tongue  breathing problems  chest pain  confusion or hallucinations  irregular heartbeat  numbness of mouth or throat  seizures Side effects that usually do not require medical attention (report to your doctor or health care professional if they continue or are  bothersome):  burning feeling in the eyes  constipation  headache  nasal congestion  stomach upset This list may not describe all possible side effects. Call your doctor for medical advice about side effects. You may report side effects to FDA at 1-800-FDA-1088. Where should I keep my medicine? Keep out of the reach of children. Store at room temperature between 15 and 30 degrees C (59 and 86 degrees F). Keep tightly closed. Protect from light and moisture. Throw away any unused medicine after the expiration date. NOTE: This sheet is a summary. It may not cover all possible information. If you have questions about this medicine, talk to your doctor, pharmacist, or health care provider.  2020 Elsevier/Gold Standard (2008-02-06 14:52:56) Amoxicillin; Clavulanic Acid Chewable Tablets What is this medicine? AMOXICILLIN; CLAVULANIC ACID (a mox i SIL in; KLAV yoo lan ic AS id) is a penicillin antibiotic. It treats some infections caused by bacteria. It will not work for colds, the flu, or other viruses. This medicine may be used  for other purposes; ask your health care provider or pharmacist if you have questions. COMMON BRAND NAME(S): Augmentin What should I tell my health care provider before I take this medicine? They need to know if you have any of these conditions:  bowel disease, like colitis  kidney disease  liver disease  mononucleosis  phenylketonuria  an unusual or allergic reaction to amoxicillin, penicillin, cephalosporin, other antibiotics, clavulanic acid, other medicines, foods, dyes, or preservatives  pregnant or trying to get pregnant  breast-feeding How should I use this medicine? Take this drug by mouth. Take it as directed on the prescription label at the same time every day. Chew or crush it completely before swallowing. Do not swallow tablets whole. You can take it with or without food. If it upsets your stomach, take it with food. Take all of this drug  unless your health care provider tells you to stop it early. Keep taking it even if you think you are better. Talk to your health care provider about the use of this drug in children. While it may be prescribed for selected conditions, precautions do apply. Overdosage: If you think you have taken too much of this medicine contact a poison control center or emergency room at once. NOTE: This medicine is only for you. Do not share this medicine with others. What if I miss a dose? If you miss a dose, take it as soon as you can. If it is almost time for your next dose, take only that dose. Do not take double or extra doses. What may interact with this medicine?  allopurinol  anticoagulants  birth control pills  methotrexate  probenecid This list may not describe all possible interactions. Give your health care provider a list of all the medicines, herbs, non-prescription drugs, or dietary supplements you use. Also tell them if you smoke, drink alcohol, or use illegal drugs. Some items may interact with your medicine. What should I watch for while using this medicine? Tell your doctor or healthcare provider if your symptoms do not improve. This medicine may cause serious skin reactions. They can happen weeks to months after starting the medicine. Contact your healthcare provider right away if you notice fevers or flu-like symptoms with a rash. The rash may be red or purple and then turn into blisters or peeling of the skin. Or, you might notice a red rash with swelling of the face, lips or lymph nodes in your neck or under your arms. Do not treat diarrhea with over the counter products. Contact your doctor if you have diarrhea that lasts more than 2 days or if it is severe and watery. If you have diabetes, you may get a false-positive result for sugar in your urine. Check with your doctor or healthcare provider. Birth control pills may not work properly while you are taking this medicine. Talk to  your doctor about using an extra method of birth control. What side effects may I notice from receiving this medicine? Side effects that you should report to your doctor or health care professional as soon as possible:  allergic reactions like skin rash, itching or hives, swelling of the face, lips, or tongue  breathing problems  dark urine  fever or chills, sore throat  redness, blistering, peeling, or loosening of the skin, including inside the mouth  seizures  trouble passing urine or change in the amount of urine  unusual bleeding, bruising  unusually weak or tired  white patches or sores in the mouth or  throat Side effects that usually do not require medical attention (report to your doctor or health care professional if they continue or are bothersome):  diarrhea  dizziness  headache  nausea, vomiting  stomach upset  vaginal or anal irritation This list may not describe all possible side effects. Call your doctor for medical advice about side effects. You may report side effects to FDA at 1-800-FDA-1088. Where should I keep my medicine? Keep out of the reach of children and pets. Store at room temperature between 20 and 25 degrees C (68 and 77 degrees F). Throw away any unused drug after the expiration date. NOTE: This sheet is a summary. It may not cover all possible information. If you have questions about this medicine, talk to your doctor, pharmacist, or health care provider.  2020 Elsevier/Gold Standard (2019-07-22 08:58:41)

## 2019-12-20 NOTE — Progress Notes (Signed)
Patient arrived to office displaying signs and symptoms of [place]. She was instructed to get tested for Coronavirus. Patient verbalized understanding.

## 2020-02-10 ENCOUNTER — Encounter: Payer: Self-pay | Admitting: Family Medicine

## 2020-02-10 ENCOUNTER — Ambulatory Visit (INDEPENDENT_AMBULATORY_CARE_PROVIDER_SITE_OTHER): Payer: Medicare PPO | Admitting: Family Medicine

## 2020-02-10 ENCOUNTER — Other Ambulatory Visit: Payer: Self-pay

## 2020-02-10 VITALS — BP 127/78 | HR 82 | Temp 98.4°F | Ht 66.0 in | Wt 152.2 lb

## 2020-02-10 DIAGNOSIS — R21 Rash and other nonspecific skin eruption: Secondary | ICD-10-CM | POA: Diagnosis not present

## 2020-02-10 DIAGNOSIS — R7303 Prediabetes: Secondary | ICD-10-CM | POA: Diagnosis not present

## 2020-02-10 DIAGNOSIS — R05 Cough: Secondary | ICD-10-CM

## 2020-02-10 DIAGNOSIS — M0579 Rheumatoid arthritis with rheumatoid factor of multiple sites without organ or systems involvement: Secondary | ICD-10-CM

## 2020-02-10 DIAGNOSIS — Z0000 Encounter for general adult medical examination without abnormal findings: Secondary | ICD-10-CM | POA: Diagnosis not present

## 2020-02-10 DIAGNOSIS — R059 Cough, unspecified: Secondary | ICD-10-CM

## 2020-02-10 DIAGNOSIS — Z09 Encounter for follow-up examination after completed treatment for conditions other than malignant neoplasm: Secondary | ICD-10-CM

## 2020-02-10 LAB — POCT URINALYSIS DIPSTICK
Bilirubin, UA: NEGATIVE
Blood, UA: NEGATIVE
Glucose, UA: NEGATIVE
Ketones, UA: NEGATIVE
Nitrite, UA: NEGATIVE
Protein, UA: NEGATIVE
Spec Grav, UA: 1.025 (ref 1.010–1.025)
Urobilinogen, UA: 1 E.U./dL
pH, UA: 6.5 (ref 5.0–8.0)

## 2020-02-10 LAB — POCT GLYCOSYLATED HEMOGLOBIN (HGB A1C): Hemoglobin A1C: 6.1 % — AB (ref 4.0–5.6)

## 2020-02-10 LAB — GLUCOSE, POCT (MANUAL RESULT ENTRY): POC Glucose: 83 mg/dl (ref 70–99)

## 2020-02-10 NOTE — Progress Notes (Signed)
[facility]    Established Patient Office Visit  Subjective:  Patient ID: Lorraine Blair, female    DOB: [birth year]  Age: 69 y.o. MRN: [id number]  CC:  Chief Complaint  Patient presents with  . Follow-up    6 mth follow up    HPI Lorraine Blair is a 69 year old female who presents for Follow Up today.   Past Medical History:  Diagnosis Date  . Allergic rhinitis due to pollen   . Carpal tunnel syndrome   . Neuromuscular disorder ([place])   . Pure hypercholesterolemia   . Rheumatoid arthritis ([place])    Current Status: Since her last office visit, she is doing well with no complaints. She continues to follow up with Rheumatologist as needed. She had began to develop a skin rash as a reaction from Remicade, and is now initiated on Abatacept ([place]). She denies fevers, chills, fatigue, recent infections, weight loss, and night sweats. She has not had any headaches, visual changes, dizziness, and falls. No chest pain, heart palpitations, cough and shortness of breath reported. No reports of GI problems such as nausea, vomiting, diarrhea, and constipation. She has no reports of blood in stools, dysuria and hematuria. No depression or anxiety reported today. She denies suicidal ideations, homicidal ideations, or auditory hallucinations. She denies pain today.   Past Surgical History:  Procedure Laterality Date  . TUBAL LIGATION  1989    Family History  Problem Relation Age of Onset  . Cancer Father   . Alzheimer's disease Father   . Rheum arthritis Sister   . Colon cancer Neg Hx     Social History   Socioeconomic History  . Marital status: Single    Spouse name: Not on file  . Number of children: Not on file  . Years of education: Not on file  . Highest education level: Not on file  Occupational History  . Not on file  Tobacco Use  . Smoking status: Former Smoker    Types: Cigarettes    Quit date: 06/24/1988   Years since quitting: 31.6  . Smokeless tobacco: Never Used  Substance and Sexual Activity  . Alcohol use: No    Alcohol/week: 1.0 standard drinks    Types: 1 Glasses of wine per week  . Drug use: No  . Sexual activity: Not Currently  Other Topics Concern  . Not on file  Social History Narrative  . Not on file   Social Determinants of Health   Financial Resource Strain:   . Difficulty of Paying Living Expenses:   Food Insecurity:   . Worried About Charity fundraiser in the Last Year:   . Arboriculturist in the Last Year:   Transportation Needs:   . Film/video editor (Medical):   Marland Kitchen Lack of Transportation (Non-Medical):   Physical Activity:   . Days of Exercise per Week:   . Minutes of Exercise per Session:   Stress:   . Feeling of Stress :   Social Connections:   . Frequency of Communication with Friends and Family:   . Frequency of Social Gatherings with Friends and Family:   . Attends Religious Services:   . Active Member of Clubs or Organizations:   . Attends Archivist Meetings:   Marland Kitchen Marital Status:   Intimate Partner Violence:   . Fear of Current or Ex-Partner:   . Emotionally Abused:   Marland Kitchen Physically Abused:   .  Sexually Abused:     Outpatient Medications Prior to Visit  Medication Sig Dispense Refill  . abatacept in sodium chloride 0.9 % Inject 750 mg into the vein. Every 4 weeks    . cholecalciferol (VITAMIN D) 1000 units tablet Take 1,000 Units by mouth daily.    . Cyanocobalamin (B-12 PO) Take 1 tablet by mouth daily.    . cycloSPORINE (RESTASIS) 0.05 % ophthalmic emulsion 1 drop 2 (two) times daily.    . predniSONE (DELTASONE) 10 MG tablet Take 4 tablets by mouth daily.   0  . TURMERIC PO Take 1 capsule by mouth daily.    . benzonatate (TESSALON) 100 MG capsule Take 1 capsule (100 mg total) by mouth 2 (two) times daily as needed for cough. 20 capsule 0  . inFLIXimab (REMICADE) 100 MG injection Inject into the vein.     No  facility-administered medications prior to visit.    No Known Allergies  ROS Review of Systems  Constitutional: Negative.   HENT: Negative.   Eyes: Negative.   Respiratory: Negative.   Cardiovascular: Negative.   Gastrointestinal: Negative.   Endocrine: Negative.   Genitourinary: Negative.   Musculoskeletal: Positive for arthralgias (generalized joint pain) and myalgias (Rheumatoid Arthritis. ).  Skin: Negative.   Allergic/Immunologic: Negative.   Neurological: Positive for dizziness (occasional ) and headaches (occasional ).  Hematological: Negative.   Psychiatric/Behavioral: Negative.    Objective:    Physical Exam  Constitutional: She is oriented to person, place, and time. She appears well-developed and well-nourished.  HENT:  Head: Normocephalic and atraumatic.  Eyes: Conjunctivae are normal.  Cardiovascular: Normal rate, regular rhythm, normal heart sounds and intact distal pulses.  Pulmonary/Chest: Effort normal and breath sounds normal.  Abdominal: Soft. Bowel sounds are normal.  Musculoskeletal:        General: Normal range of motion.     Cervical back: Normal range of motion and neck supple.  Neurological: She is alert and oriented to person, place, and time. She has normal reflexes.  Skin: Skin is warm and dry.  Psychiatric: She has a normal mood and affect. Her behavior is normal. Judgment and thought content normal.  Nursing note and vitals reviewed.   BP 127/78   Pulse 82   Temp 98.4 F (36.9 C) (Oral)   Ht 5\' 6"  (1.676 m)   Wt 152 lb 3.2 oz (69 kg)   SpO2 100%   BMI 24.57 kg/m  Wt Readings from Last 3 Encounters:  02/10/20 152 lb 3.2 oz (69 kg)  11/25/19 157 lb (71.2 kg)  11/19/19 155 lb 12.8 oz (70.7 kg)     Health Maintenance Due  Topic Date Due  . Hepatitis C Screening  Never done  . DEXA SCAN  Never done  . MAMMOGRAM  05/18/2018  . PNA vac Low Risk Adult (2 of 2 - PPSV23) 07/18/2019  . COLONOSCOPY  09/23/2019    There are no  preventive care reminders to display for this patient.  Lab Results  Component Value Date   TSH 2.163 10/27/2015   Lab Results  Component Value Date   WBC 5.4 02/08/2019   HGB 13.2 02/08/2019   HCT 38.1 02/08/2019   MCV 91 02/08/2019   PLT 201 02/08/2019   Lab Results  Component Value Date   NA 140 08/12/2019   K 4.0 08/12/2019   CO2 24 08/12/2019   GLUCOSE 84 08/12/2019   BUN 15 08/12/2019   CREATININE 0.79 08/12/2019   BILITOT 0.2 08/12/2019  ALKPHOS 112 08/12/2019   AST 22 08/12/2019   ALT 13 08/12/2019   PROT 7.9 08/12/2019   ALBUMIN 4.0 08/12/2019   CALCIUM 9.3 08/12/2019   Lab Results  Component Value Date   CHOL 199 02/08/2019   Lab Results  Component Value Date   HDL 77 02/08/2019   Lab Results  Component Value Date   LDLCALC 105 (H) 02/08/2019   Lab Results  Component Value Date   TRIG 86 02/08/2019   Lab Results  Component Value Date   CHOLHDL 2.6 02/08/2019   Lab Results  Component Value Date   HGBA1C 6.1 (A) 02/10/2020      Assessment & Plan:   1. Rheumatoid arthritis involving multiple sites with positive rheumatoid factor ([place]) Continue to follow up with Rheumatologist as needed.   2. Prediabetes Hgb A1c is stable at 6.1 today. Monitor.  - POCT urinalysis dipstick - POCT glycosylated hemoglobin (Hb A1C) - POCT glucose (manual entry)  3. Cough Stable today.   4. Skin rash Improved since discontinuing Remicade.   5. Healthcare maintenance - POCT urinalysis dipstick  6. Follow up She will follow up in 2 months.   No orders of the defined types were placed in this encounter.   Orders Placed This Encounter  Procedures  . POCT urinalysis dipstick  . POCT glycosylated hemoglobin (Hb A1C)  . POCT glucose (manual entry)    Referral Orders  No referral(s) requested today    Kathe Becton,  MSN, FNP-BC [address] [place], [place]  [phone]   Problem List Items Addressed This Visit      Musculoskeletal and Integument   Rheumatoid arthritis ([place]) - Primary   Relevant Medications   abatacept in sodium chloride 0.9 %   Skin rash     Other   Prediabetes   Relevant Orders   POCT urinalysis dipstick (Completed)   POCT glycosylated hemoglobin (Hb A1C) (Completed)   POCT glucose (manual entry) (Completed)    Other Visit Diagnoses    Cough       Healthcare maintenance       Relevant Orders   POCT urinalysis dipstick (Completed)   Follow up          No orders of the defined types were placed in this encounter.   Follow-up: Return in about 6 months (around 08/12/2020).    Azzie Glatter, FNP

## 2020-02-20 DIAGNOSIS — M329 Systemic lupus erythematosus, unspecified: Secondary | ICD-10-CM

## 2020-02-20 HISTORY — DX: Systemic lupus erythematosus, unspecified: M32.9

## 2020-03-21 DIAGNOSIS — Z9181 History of falling: Secondary | ICD-10-CM

## 2020-03-21 HISTORY — DX: History of falling: Z91.81

## 2020-04-14 ENCOUNTER — Ambulatory Visit (HOSPITAL_COMMUNITY)
Admission: RE | Admit: 2020-04-14 | Discharge: 2020-04-14 | Disposition: A | Discharge status: Home / Self Care | Payer: Medicare PPO | Source: Ambulatory Visit | Attending: Family Medicine | Admitting: Family Medicine

## 2020-04-14 ENCOUNTER — Ambulatory Visit: Payer: Medicare PPO | Admitting: Family Medicine

## 2020-04-14 ENCOUNTER — Other Ambulatory Visit: Payer: Self-pay

## 2020-04-14 ENCOUNTER — Encounter: Payer: Self-pay | Admitting: Family Medicine

## 2020-04-14 VITALS — BP 105/68 | HR 82 | Temp 98.7°F | Ht 66.0 in | Wt 150.0 lb

## 2020-04-14 DIAGNOSIS — M0579 Rheumatoid arthritis with rheumatoid factor of multiple sites without organ or systems involvement: Secondary | ICD-10-CM

## 2020-04-14 DIAGNOSIS — Z9181 History of falling: Secondary | ICD-10-CM

## 2020-04-14 DIAGNOSIS — M25411 Effusion, right shoulder: Secondary | ICD-10-CM | POA: Diagnosis present

## 2020-04-14 DIAGNOSIS — M329 Systemic lupus erythematosus, unspecified: Secondary | ICD-10-CM

## 2020-04-14 DIAGNOSIS — Z09 Encounter for follow-up examination after completed treatment for conditions other than malignant neoplasm: Secondary | ICD-10-CM

## 2020-04-14 NOTE — Progress Notes (Signed)
Patient [facility]  Sick Visit  Subjective:  Patient ID: Lorraine Blair, female    DOB: 10-22-1951  Age: 69 y.o. MRN: [id number]  CC:  Chief Complaint  Patient presents with  . Fall    Fall on 03/28/2020    HPI Lorraine Blair is a 69 year old female who presents for Follow Up today.   Past Medical History:  Diagnosis Date  . Allergic rhinitis due to pollen   . Carpal tunnel syndrome   . History of recent fall 03/2020  . Lupus ([place]) 02/2020  . Neuromuscular disorder ([place])   . Pure hypercholesterolemia   . Rheumatoid arthritis [facility])    Patient Active Problem List   Diagnosis Date Noted  . Throat infection 11/25/2019  . Skin rash 11/25/2019  . Muscle spasms of both lower extremities 08/12/2019  . Diplopia 02/19/2018  . Rheumatoid arthritis ([place]) 02/19/2018  . 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 Cough variant asthma vs UACS  11/03/2016  . History of hyperlipidemia 05/02/2016  . Prediabetes 10/28/2015  . Sinusitis, acute, maxillary 10/27/2015  . Allergic cough 05/19/2015  . Post-nasal drip 05/19/2015  . Need for Tdap vaccination 07/17/2014  . Immunization due 07/17/2014  . Encounter for screening colonoscopy 07/17/2014  . Hyperlipidemia LDL goal <100 06/24/2013  . Abdominal pain, other specified site 06/24/2013    Current Status: Since her last office visit, she is doing well with no complaints. She has has a recent fall 2 weeks ago. She did suffer right neck and shoulder strain and right side of facial area. She has chronic history of RA and was also recently diagnosed with Lupus. She continues to follow up with Rheumatologist as needed with follow up on tomorrow. She continues therapy with Orencia. She denies fevers, chills, fatigue, recent infections, weight loss, and night sweats. She has not had any headaches, visual changes, dizziness, and falls. No chest pain, heart palpitations, cough and shortness  of breath reported. Denies GI problems such as nausea, vomiting, diarrhea, and constipation. She has no reports of blood in stools, dysuria and hematuria. No depression or anxiety reported today. She denies suicidal ideations, homicidal ideations, or auditory hallucinations. She is taking all medications as prescribed. She denies pain today.   Past Surgical History:  Procedure Laterality Date  . TUBAL LIGATION  1989    Family History  Problem Relation Age of Onset  . Cancer Father   . Alzheimer's disease Father   . Rheum arthritis Sister   . Colon cancer Neg Hx     Social History   Socioeconomic History  . Marital status: Single    Spouse name: Not on file  . Number of children: Not on file  . Years of education: Not on file  . Highest education level: Not on file  Occupational History  . Not on file  Tobacco Use  . Smoking status: Former Smoker    Types: Cigarettes    Quit date: 06/24/1988    Years since quitting: 31.8  . Smokeless tobacco: Never Used  Substance and Sexual Activity  . Alcohol use: No    Alcohol/week: 1.0 standard drinks    Types: 1 Glasses of wine per week  . Drug use: No  . Sexual activity: Not Currently  Other Topics Concern  . Not on file  Social History Narrative  . Not on file   Social Determinants of Health   Financial Resource Strain:   . Difficulty of Paying  Living Expenses:   Food Insecurity:   . Worried About Charity fundraiser in the Last Year:   . Arboriculturist in the Last Year:   Transportation Needs:   . Film/video editor (Medical):   Marland Kitchen Lack of Transportation (Non-Medical):   Physical Activity:   . Days of Exercise per Week:   . Minutes of Exercise per Session:   Stress:   . Feeling of Stress :   Social Connections:   . Frequency of Communication with Friends and Family:   . Frequency of Social Gatherings with Friends and Family:   . Attends Religious Services:   . Active Member of Clubs or Organizations:   . Attends  Archivist Meetings:   Marland Kitchen Marital Status:   Intimate Partner Violence:   . Fear of Current or Ex-Partner:   . Emotionally Abused:   Marland Kitchen Physically Abused:   . Sexually Abused:     Outpatient Medications Prior to Visit  Medication Sig Dispense Refill  . Abatacept (ORENCIA IV) Inject into the vein.    Marland Kitchen abatacept in sodium chloride 0.9 % Inject 750 mg into the vein. Every 4 weeks    . cholecalciferol (VITAMIN D) 1000 units tablet Take 1,000 Units by mouth daily.    . Cyanocobalamin (B-12 PO) Take 1 tablet by mouth daily.    . cycloSPORINE (RESTASIS) 0.05 % ophthalmic emulsion 1 drop 2 (two) times daily.    . predniSONE (DELTASONE) 10 MG tablet Take 4 tablets by mouth daily.   0  . TURMERIC PO Take 1 capsule by mouth daily.     No facility-administered medications prior to visit.    No Known Allergies  ROS Review of Systems  Constitutional: Negative.   HENT: Negative.   Eyes: Negative.   Respiratory: Negative.   Cardiovascular: Negative.   Gastrointestinal: Negative.   Endocrine: Negative.   Genitourinary: Negative.   Allergic/Immunologic: Negative.   Neurological: Negative.   Hematological: Negative.   Psychiatric/Behavioral: Negative.       Objective:    Physical Exam  Musculoskeletal:       Arms:    BP 105/68   Pulse 82   Temp 98.7 F (37.1 C)   Ht 5\' 6"  (1.676 m)   Wt 150 lb (68 kg)   SpO2 98%   BMI 24.21 kg/m  Wt Readings from Last 3 Encounters:  04/14/20 150 lb (68 kg)  02/10/20 152 lb 3.2 oz (69 kg)  11/25/19 157 lb (71.2 kg)     Health Maintenance Due  Topic Date Due  . Hepatitis C Screening  Never done  . COVID-19 Vaccine (1) Never done  . DEXA SCAN  Never done  . MAMMOGRAM  05/18/2018  . PNA vac Low Risk Adult (2 of 2 - PPSV23) 07/18/2019  . COLONOSCOPY  09/23/2019    There are no preventive care reminders to display for this patient.  Lab Results  Component Value Date   TSH 2.163 10/27/2015   Lab Results  Component Value  Date   WBC 5.4 02/08/2019   HGB 13.2 02/08/2019   HCT 38.1 02/08/2019   MCV 91 02/08/2019   PLT 201 02/08/2019   Lab Results  Component Value Date   NA 140 08/12/2019   K 4.0 08/12/2019   CO2 24 08/12/2019   GLUCOSE 84 08/12/2019   BUN 15 08/12/2019   CREATININE 0.79 08/12/2019   BILITOT 0.2 08/12/2019   ALKPHOS 112 08/12/2019   AST 22 08/12/2019  ALT 13 08/12/2019   PROT 7.9 08/12/2019   ALBUMIN 4.0 08/12/2019   CALCIUM 9.3 08/12/2019   Lab Results  Component Value Date   CHOL 199 02/08/2019   Lab Results  Component Value Date   HDL 77 02/08/2019   Lab Results  Component Value Date   LDLCALC 105 (H) 02/08/2019   Lab Results  Component Value Date   TRIG 86 02/08/2019   Lab Results  Component Value Date   CHOLHDL 2.6 02/08/2019   Lab Results  Component Value Date   HGBA1C 6.1 (A) 02/10/2020      Assessment & Plan:   1. Swelling of joint of right shoulder - DG Shoulder Right; Future  2. Rheumatoid arthritis involving multiple sites with positive rheumatoid factor (HCC)  3. Systemic lupus erythematosus, unspecified SLE type, unspecified organ involvement status ([place])  4. History of recent fall - DG Shoulder Right; Future  5. Follow up She will follow up in 4 months.   No orders of the defined types were placed in this encounter.   Orders Placed This Encounter  Procedures  . DG Shoulder Right   Referral Orders  No referral(s) requested today    Kathe Becton,  MSN, FNP-BC [place], [place] [address] [phone] fax  Problem List Items Addressed This Visit      Musculoskeletal and Integument   Rheumatoid arthritis ([place])   Relevant Medications   Abatacept (ORENCIA IV)    Other Visit Diagnoses    Swelling of joint of right shoulder    -  Primary   Relevant Orders   DG Shoulder Right (Completed)   Systemic lupus  erythematosus, unspecified SLE type, unspecified organ involvement status ([place])       History of recent fall       Relevant Orders   DG Shoulder Right (Completed)   Follow up          No orders of the defined types were placed in this encounter.   Follow-up: No follow-ups on file.    Azzie Glatter, FNP

## 2020-04-14 NOTE — Patient Instructions (Signed)
RICE Therapy for Routine Care of Injuries Many injuries can be cared for with rest, ice, compression, and elevation (RICE therapy). This includes:  Resting the injured part.  Putting ice on the injury.  Putting pressure (compression) on the injury.  Raising the injured part (elevation). Using RICE therapy can help to lessen pain and swelling. Supplies needed:  Ice.  Plastic bag.  Towel.  Elastic bandage.  Pillow or pillows to raise (elevate) your injured body part. How to care for your injury with RICE therapy Rest Limit your normal activities, and try not to use the injured part of your body. You can go back to your normal activities when your doctor says it is okay to do them and you feel okay. Ask your doctor if you should do exercises to help your injury get better. Ice Put ice on the injured area. Do not put ice on your bare skin.  Put ice in a plastic bag.  Place a towel between your skin and the bag.  Leave the ice on for 20 minutes, 2-3 times a day. Use ice on as many days as told by your doctor.  Compression Compression means putting pressure on the injured area. This can be done with an elastic bandage. If an elastic bandage has been put on your injury:  Do not wrap the bandage too tight. Wrap the bandage more loosely if part of your body away from the bandage is blue, swollen, cold, painful, or loses feeling (gets numb).  Take off the bandage and put it on again. Do this every 3-4 hours or as told by your doctor.  See your doctor if the bandage seems to make your problems worse.  Elevation Elevation means keeping the injured area raised. If you can, raise the injured area above your heart or the center of your chest. Contact a doctor if:  You keep having pain and swelling.  Your symptoms get worse. Get help right away if:  You have sudden bad pain at your injury or lower than your injury.  You have redness or more swelling around your injury.  You  have tingling or numbness at your injury or lower than your injury, and it does not go away when you take off the bandage. Summary  Many injuries can be cared for using rest, ice, compression, and elevation (RICE therapy).  You can go back to your normal activities when you feel okay and your doctor says it is okay.  Put ice on the injured area as told by your doctor.  Get help if your symptoms get worse or if you keep having pain and swelling. This information is not intended to replace advice given to you by your health care provider. Make sure you discuss any questions you have with your health care provider. Document Revised: 07/28/2017 Document Reviewed: 07/28/2017 Elsevier Patient Education  2020 Elsevier Inc.  

## 2020-04-15 DIAGNOSIS — M25411 Effusion, right shoulder: Secondary | ICD-10-CM | POA: Insufficient documentation

## 2020-04-15 DIAGNOSIS — L931 Subacute cutaneous lupus erythematosus: Secondary | ICD-10-CM | POA: Insufficient documentation

## 2020-04-15 DIAGNOSIS — M329 Systemic lupus erythematosus, unspecified: Secondary | ICD-10-CM | POA: Insufficient documentation

## 2020-04-15 DIAGNOSIS — Z9181 History of falling: Secondary | ICD-10-CM | POA: Insufficient documentation

## 2020-08-12 ENCOUNTER — Ambulatory Visit (INDEPENDENT_AMBULATORY_CARE_PROVIDER_SITE_OTHER): Payer: Medicare PPO | Admitting: Family Medicine

## 2020-08-12 ENCOUNTER — Other Ambulatory Visit: Payer: Self-pay

## 2020-08-12 ENCOUNTER — Encounter: Payer: Self-pay | Admitting: Family Medicine

## 2020-08-12 VITALS — BP 127/72 | HR 74 | Temp 97.3°F | Ht 66.0 in | Wt 156.6 lb

## 2020-08-12 DIAGNOSIS — M0579 Rheumatoid arthritis with rheumatoid factor of multiple sites without organ or systems involvement: Secondary | ICD-10-CM

## 2020-08-12 DIAGNOSIS — Z0000 Encounter for general adult medical examination without abnormal findings: Secondary | ICD-10-CM

## 2020-08-12 DIAGNOSIS — R7309 Other abnormal glucose: Secondary | ICD-10-CM

## 2020-08-12 DIAGNOSIS — M329 Systemic lupus erythematosus, unspecified: Secondary | ICD-10-CM

## 2020-08-12 DIAGNOSIS — Z23 Encounter for immunization: Secondary | ICD-10-CM

## 2020-08-12 DIAGNOSIS — M25411 Effusion, right shoulder: Secondary | ICD-10-CM

## 2020-08-12 DIAGNOSIS — R8290 Unspecified abnormal findings in urine: Secondary | ICD-10-CM

## 2020-08-12 DIAGNOSIS — R7303 Prediabetes: Secondary | ICD-10-CM

## 2020-08-12 DIAGNOSIS — R1011 Right upper quadrant pain: Secondary | ICD-10-CM

## 2020-08-12 DIAGNOSIS — R1012 Left upper quadrant pain: Secondary | ICD-10-CM

## 2020-08-12 DIAGNOSIS — Z09 Encounter for follow-up examination after completed treatment for conditions other than malignant neoplasm: Secondary | ICD-10-CM

## 2020-08-12 LAB — GLUCOSE, POCT (MANUAL RESULT ENTRY): POC Glucose: 70 mg/dl (ref 70–99)

## 2020-08-12 LAB — POCT URINALYSIS DIPSTICK
Bilirubin, UA: NEGATIVE
Blood, UA: NEGATIVE
Glucose, UA: NEGATIVE
Ketones, UA: NEGATIVE
Nitrite, UA: NEGATIVE
Protein, UA: NEGATIVE
Spec Grav, UA: >=1.03 — AB (ref 1.010–1.025)
Urobilinogen, UA: 0.2 E.U./dL
pH, UA: 5.5 (ref 5.0–8.0)

## 2020-08-12 LAB — POCT GLYCOSYLATED HEMOGLOBIN (HGB A1C)
HbA1c POC (<> result, manual entry): 6.4 % (ref 4.0–5.6)
HbA1c, POC (controlled diabetic range): 6.4 % (ref 0.0–7.0)
HbA1c, POC (prediabetic range): 6.4 % (ref 5.7–6.4)
Hemoglobin A1C: 6.4 % — AB (ref 4.0–5.6)

## 2020-08-12 NOTE — Progress Notes (Signed)
[facility]   Annual Physical   Subjective:  Patient ID: MAELA TAKEDA, female    DOB: 04-Jun-1951  Age: 69 y.o. MRN: [id number]  CC:  Chief Complaint  Patient presents with  . Follow-up    Pt states she wants  to discuss if she need any immunization injctions.  . Abdominal Pain    X1 month    HPI Lorraine Blair is a 69 year old female who presents for her Annual Physical today.    Patient Active Problem List   Diagnosis Date Noted  . Systemic lupus erythematosus ([place]) 04/15/2020  . History of recent fall 04/15/2020  . Swelling of joint of right shoulder 04/15/2020  . Throat infection 11/25/2019  . Skin rash 11/25/2019  . Muscle spasms of both lower extremities 08/12/2019  . Diplopia 02/19/2018  . Rheumatoid arthritis ([place]) 02/19/2018  . 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 Cough variant asthma vs UACS  11/03/2016  . History of hyperlipidemia 05/02/2016  . Prediabetes 10/28/2015  . Sinusitis, acute, maxillary 10/27/2015  . Allergic cough 05/19/2015  . Post-nasal drip 05/19/2015  . Need for Tdap vaccination 07/17/2014  . Immunization due 07/17/2014  . Encounter for screening colonoscopy 07/17/2014  . Hyperlipidemia LDL goal <100 06/24/2013  . Abdominal pain, other specified site 06/24/2013   Current Status: Since her last office visit, she is doing well with no complaints. She continues to follow up with Rheumatologist as needed for RA. She states that she is currently taking Orencia once monthly for treatment. She is also taking Hydoxychloroquine 200 mg daily for treatment. She has been feeling much better since discontinuing Remicade. Her pain is stable today. She has c/o intermittent upper abdominal pain X 3-4 months. She denies fevers, chills, fatigue, recent infections, weight loss, and night sweats. Se has not had any headaches, visual changes, dizziness, and falls. No chest pain, heart  palpitations, cough and shortness of breath reported. Denies GI problems such as nausea, vomiting, diarrhea, and constipation. She has no reports of blood in stools, dysuria and hematuria. No depression or anxiety reported today. She is taking all medications as prescribed.   Past Medical History:  Diagnosis Date  . Allergic rhinitis due to pollen   . Carpal tunnel syndrome   . History of recent fall 03/2020  . Lupus ([place]) 02/2020  . Neuromuscular disorder ([place])   . Pure hypercholesterolemia   . Rheumatoid arthritis [facility])     Past Surgical History:  Procedure Laterality Date  . TUBAL LIGATION  1989    Family History  Problem Relation Age of Onset  . Cancer Father   . Alzheimer's disease Father   . Rheum arthritis Sister   . Colon cancer Neg Hx     Social History   Socioeconomic History  . Marital status: Single    Spouse name: Not on file  . Number of children: Not on file  . Years of education: Not on file  . Highest education level: Not on file  Occupational History  . Not on file  Tobacco Use  . Smoking status: Former Smoker    Types: Cigarettes    Quit date: 06/24/1988    Years since quitting: 32.1  . Smokeless tobacco: Never Used  Vaping Use  . Vaping Use: Never used  Substance and Sexual Activity  . Alcohol use: No    Alcohol/week: 1.0 standard drink    Types: 1 Glasses of wine per week  .  Drug use: No  . Sexual activity: Not Currently  Other Topics Concern  . Not on file  Social History Narrative  . Not on file   Social Determinants of Health   Financial Resource Strain:   . Difficulty of Paying Living Expenses: Not on file  Food Insecurity:   . Worried About Charity fundraiser in the Last Year: Not on file  . Ran Out of Food in the Last Year: Not on file  Transportation Needs:   . Lack of Transportation (Medical): Not on file  . Lack of Transportation (Non-Medical): Not on file  Physical Activity:   . Days of Exercise per Week: Not on file  .  Minutes of Exercise per Session: Not on file  Stress:   . Feeling of Stress : Not on file  Social Connections:   . Frequency of Communication with Friends and Family: Not on file  . Frequency of Social Gatherings with Friends and Family: Not on file  . Attends Religious Services: Not on file  . Active Member of Clubs or Organizations: Not on file  . Attends Archivist Meetings: Not on file  . Marital Status: Not on file  Intimate Partner Violence:   . Fear of Current or Ex-Partner: Not on file  . Emotionally Abused: Not on file  . Physically Abused: Not on file  . Sexually Abused: Not on file    Outpatient Medications Prior to Visit  Medication Sig Dispense Refill  . Abatacept (ORENCIA IV) Inject into the vein.    Marland Kitchen abatacept in sodium chloride 0.9 % Inject 750 mg into the vein. Every 4 weeks    . cholecalciferol (VITAMIN D) 1000 units tablet Take 1,000 Units by mouth daily.    . Cyanocobalamin (B-12 PO) Take 1 tablet by mouth daily.    . cycloSPORINE (RESTASIS) 0.05 % ophthalmic emulsion 1 drop 2 (two) times daily.    . hydroxychloroquine (PLAQUENIL) 200 MG tablet Take 200 mg by mouth daily.    . predniSONE (DELTASONE) 10 MG tablet Take 4 tablets by mouth daily.   0  . TURMERIC PO Take 1 capsule by mouth daily.     No facility-administered medications prior to visit.    No Known Allergies  ROS Review of Systems  Constitutional: Negative.   HENT: Negative.   Eyes: Negative.   Respiratory: Negative.   Cardiovascular: Negative.   Gastrointestinal: Negative.   Endocrine: Negative.   Genitourinary: Negative.   Musculoskeletal: Positive for arthralgias (generalized joint pain r/t Lupus and RA).  Skin: Negative.   Allergic/Immunologic: Negative.   Neurological: Positive for weakness (r/t Lupus).  Hematological: Negative.   Psychiatric/Behavioral: Negative.    Objective:    Physical Exam Vitals and nursing note reviewed.  Constitutional:      Appearance:  Normal appearance. She is well-developed.  HENT:     Head: Normocephalic and atraumatic.     Nose: Nose normal.     Mouth/Throat:     Mouth: Mucous membranes are moist.     Pharynx: Oropharynx is clear.  Cardiovascular:     Rate and Rhythm: Normal rate and regular rhythm.     Pulses: Normal pulses.     Heart sounds: Normal heart sounds.  Pulmonary:     Effort: Pulmonary effort is normal.     Breath sounds: Normal breath sounds.  Abdominal:     General: Bowel sounds are normal.     Palpations: Abdomen is soft.  Musculoskeletal:  General: Normal range of motion.     Cervical back: Normal range of motion and neck supple.     Comments: Generalized pain r/t diagnosis of Lupus and RA  Skin:    General: Skin is warm and dry.  Neurological:     General: No focal deficit present.     Mental Status: She is alert and oriented to person, place, and time.  Psychiatric:        Mood and Affect: Mood normal.        Behavior: Behavior normal.        Thought Content: Thought content normal.        Judgment: Judgment normal.     BP 127/72 (BP Location: Right Arm, Patient Position: Sitting, Cuff Size: Normal)   Pulse 74   Temp (!) 97.3 F (36.3 C)   Ht 5\' 6"  (1.676 m)   Wt 156 lb 9.6 oz (71 kg)   SpO2 100%   BMI 25.28 kg/m  Wt Readings from Last 3 Encounters:  08/12/20 156 lb 9.6 oz (71 kg)  04/14/20 150 lb (68 kg)  02/10/20 152 lb 3.2 oz (69 kg)     Health Maintenance Due  Topic Date Due  . Hepatitis C Screening  Never done  . COVID-19 Vaccine (1) Never done  . DEXA SCAN  Never done  . MAMMOGRAM  05/18/2018  . PNA vac Low Risk Adult (2 of 2 - PPSV23) 07/18/2019  . COLONOSCOPY  09/23/2019    There are no preventive care reminders to display for this patient.  Lab Results  Component Value Date   TSH 2.163 10/27/2015   Lab Results  Component Value Date   WBC 5.4 02/08/2019   HGB 13.2 02/08/2019   HCT 38.1 02/08/2019   MCV 91 02/08/2019   PLT 201 02/08/2019    Lab Results  Component Value Date   NA 140 08/12/2019   K 4.0 08/12/2019   CO2 24 08/12/2019   GLUCOSE 84 08/12/2019   BUN 15 08/12/2019   CREATININE 0.79 08/12/2019   BILITOT 0.2 08/12/2019   ALKPHOS 112 08/12/2019   AST 22 08/12/2019   ALT 13 08/12/2019   PROT 7.9 08/12/2019   ALBUMIN 4.0 08/12/2019   CALCIUM 9.3 08/12/2019   Lab Results  Component Value Date   CHOL 199 02/08/2019   Lab Results  Component Value Date   HDL 77 02/08/2019   Lab Results  Component Value Date   LDLCALC 105 (H) 02/08/2019   Lab Results  Component Value Date   TRIG 86 02/08/2019   Lab Results  Component Value Date   CHOLHDL 2.6 02/08/2019   Lab Results  Component Value Date   HGBA1C 6.4 (A) 08/12/2020   HGBA1C 6.4 08/12/2020   HGBA1C 6.4 08/12/2020   HGBA1C 6.4 08/12/2020   Assessment & Plan:   1. Annual physical exam  2. Rheumatoid arthritis involving multiple sites with positive rheumatoid factor (HCC) - Urinalysis Dipstick - POC Glucose (CBG) - POC HgB A1c - CBC with Differential - Comprehensive metabolic panel - Lipid Panel - TSH - Vitamin B12 - Vitamin D, 25-hydroxy  3. Systemic lupus erythematosus, unspecified SLE type, unspecified organ involvement status ([place]) Stable. He will continue to follow up with Rheumatologist as needed.   4. Swelling of joint of right shoulder  5. Bilateral upper abdominal discomfort - H. pylori breath test  6. Prediabetes  7. Hemoglobin A1c less than 7.0% Hgb A1c is stable at 6.4 today. Monitor.   8.  Abnormal urine finding - Urine Culture  9. Influenza vaccine needed She will get Influenza Vaccine after infusion of Orencia infusion  10. Abnormal urinalysis  11. Follow up She will follow up in 1 year.   Orders Placed This Encounter  Procedures  . Urine Culture  . CBC with Differential  . Comprehensive metabolic panel  . Lipid Panel  . TSH  . Vitamin B12  . Vitamin D, 25-hydroxy  . H. pylori breath test  .  Urinalysis Dipstick  . POC Glucose (CBG)  . POC HgB A1c   Referral Orders  No referral(s) requested today   Kathe Becton,  MSN, FNP-BC [place], [place] [address] [phone]- fax   Problem List Items Addressed This Visit      Musculoskeletal and Integument   Rheumatoid arthritis ([place]) - Primary   Relevant Medications   hydroxychloroquine (PLAQUENIL) 200 MG tablet   Other Relevant Orders   Urinalysis Dipstick (Completed)   POC Glucose (CBG) (Completed)   POC HgB A1c (Completed)   CBC with Differential   Comprehensive metabolic panel   Lipid Panel   TSH   Vitamin B12   Vitamin D, 25-hydroxy   Swelling of joint of right shoulder     Other   Prediabetes   Systemic lupus erythematosus ([place])    Other Visit Diagnoses    Annual physical exam       Bilateral upper abdominal discomfort       Relevant Orders   H. pylori breath test   Hemoglobin A1c less than 7.0%       Abnormal urine finding       Relevant Orders   Urine Culture   Influenza vaccine needed       Abnormal urinalysis       Follow up          No orders of the defined types were placed in this encounter.   Follow-up: No follow-ups on file.    Azzie Glatter, FNP

## 2020-08-13 LAB — CBC WITH DIFFERENTIAL/PLATELET
Basophils Absolute: 0 10*3/uL (ref 0.0–0.2)
Basos: 0 %
EOS (ABSOLUTE): 0.1 10*3/uL (ref 0.0–0.4)
Eos: 2 %
Hematocrit: 34.6 % (ref 34.0–46.6)
Hemoglobin: 11.5 g/dL (ref 11.1–15.9)
Immature Grans (Abs): 0 10*3/uL (ref 0.0–0.1)
Immature Granulocytes: 1 %
Lymphocytes Absolute: 2.1 10*3/uL (ref 0.7–3.1)
Lymphs: 48 %
MCH: 30.1 pg (ref 26.6–33.0)
MCHC: 33.2 g/dL (ref 31.5–35.7)
MCV: 91 fL (ref 79–97)
Monocytes Absolute: 0.3 10*3/uL (ref 0.1–0.9)
Monocytes: 7 %
Neutrophils Absolute: 1.8 10*3/uL (ref 1.4–7.0)
Neutrophils: 42 %
Platelets: 215 10*3/uL (ref 150–450)
RBC: 3.82 x10E6/uL (ref 3.77–5.28)
RDW: 13.2 % (ref 11.7–15.4)
WBC: 4.2 10*3/uL (ref 3.4–10.8)

## 2020-08-13 LAB — COMPREHENSIVE METABOLIC PANEL
ALT: 12 IU/L (ref 0–32)
AST: 22 IU/L (ref 0–40)
Albumin/Globulin Ratio: 1.2 (ref 1.2–2.2)
Albumin: 4 g/dL (ref 3.8–4.8)
Alkaline Phosphatase: 81 IU/L (ref 44–121)
BUN/Creatinine Ratio: 16 (ref 12–28)
BUN: 15 mg/dL (ref 8–27)
Bilirubin Total: 0.2 mg/dL (ref 0.0–1.2)
CO2: 25 mmol/L (ref 20–29)
Calcium: 9.7 mg/dL (ref 8.7–10.3)
Chloride: 103 mmol/L (ref 96–106)
Creatinine, Ser: 0.94 mg/dL (ref 0.57–1.00)
GFR calc Af Amer: 72 mL/min/{1.73_m2} (ref >59)
GFR calc non Af Amer: 62 mL/min/{1.73_m2} (ref >59)
Globulin, Total: 3.3 g/dL (ref 1.5–4.5)
Glucose: 82 mg/dL (ref 65–99)
Potassium: 4.2 mmol/L (ref 3.5–5.2)
Sodium: 142 mmol/L (ref 134–144)
Total Protein: 7.3 g/dL (ref 6.0–8.5)

## 2020-08-13 LAB — VITAMIN B12: Vitamin B-12: 554 pg/mL (ref 232–1245)

## 2020-08-13 LAB — VITAMIN D 25 HYDROXY (VIT D DEFICIENCY, FRACTURES): Vit D, 25-Hydroxy: 58.1 ng/mL (ref 30.0–100.0)

## 2020-08-13 LAB — TSH: TSH: 1.65 u[IU]/mL (ref 0.450–4.500)

## 2020-08-13 LAB — LIPID PANEL
Chol/HDL Ratio: 2.9 ratio (ref 0.0–4.4)
Cholesterol, Total: 184 mg/dL (ref 100–199)
HDL: 64 mg/dL (ref >39)
LDL Chol Calc (NIH): 108 mg/dL — ABNORMAL HIGH (ref 0–99)
Triglycerides: 64 mg/dL (ref 0–149)
VLDL Cholesterol Cal: 12 mg/dL (ref 5–40)

## 2020-08-14 ENCOUNTER — Other Ambulatory Visit: Payer: Self-pay | Admitting: Family Medicine

## 2020-08-14 ENCOUNTER — Encounter: Payer: Self-pay | Admitting: Family Medicine

## 2020-08-14 DIAGNOSIS — R8290 Unspecified abnormal findings in urine: Secondary | ICD-10-CM | POA: Insufficient documentation

## 2020-08-14 DIAGNOSIS — A048 Other specified bacterial intestinal infections: Secondary | ICD-10-CM

## 2020-08-14 LAB — URINE CULTURE

## 2020-08-14 LAB — H. PYLORI BREATH TEST: H pylori Breath Test: POSITIVE — AB

## 2020-08-14 MED ORDER — AMOXICILLIN 500 MG PO TABS: ORAL_TABLET | ORAL | 0 refills | Status: DC

## 2020-08-14 MED ORDER — OMEPRAZOLE 40 MG PO CPDR: 40.0000 mg | DELAYED_RELEASE_CAPSULE | Freq: Every day | ORAL | 0 refills | Status: DC

## 2020-08-14 MED ORDER — METRONIDAZOLE 500 MG PO TABS: 500.0000 mg | ORAL_TABLET | Freq: Two times a day (BID) | ORAL | 0 refills | Status: AC

## 2020-08-18 ENCOUNTER — Telehealth: Payer: Self-pay

## 2020-08-18 NOTE — Telephone Encounter (Signed)
-----   Message from Azzie Glatter, [place] sent at 08/14/2020  7:02 PM EDT ----- Positive for H. Pylori Infection. Rxs sent to pharmacy today.

## 2020-08-20 NOTE — Telephone Encounter (Signed)
-----   Message from Azzie Glatter, FNP sent at 08/13/2020  7:31 PM EDT ----- All labs are stable. Keep follow up appointment. Please inform patient. Thank you.

## 2020-09-09 ENCOUNTER — Encounter: Payer: Self-pay | Admitting: Family Medicine

## 2020-09-15 ENCOUNTER — Other Ambulatory Visit: Payer: Self-pay | Admitting: Nurse Practitioner

## 2020-09-15 ENCOUNTER — Telehealth: Payer: Self-pay | Admitting: Family Medicine

## 2020-09-15 DIAGNOSIS — A048 Other specified bacterial intestinal infections: Secondary | ICD-10-CM

## 2020-09-15 NOTE — Telephone Encounter (Signed)
Order placed

## 2020-09-16 ENCOUNTER — Other Ambulatory Visit: Payer: Medicare PPO

## 2020-09-16 ENCOUNTER — Telehealth: Payer: Medicare PPO | Admitting: Nurse Practitioner

## 2020-09-16 ENCOUNTER — Other Ambulatory Visit: Payer: Self-pay

## 2020-09-16 DIAGNOSIS — A048 Other specified bacterial intestinal infections: Secondary | ICD-10-CM

## 2020-09-18 LAB — H. PYLORI BREATH TEST: H pylori Breath Test: NEGATIVE

## 2020-09-21 DIAGNOSIS — Z79899 Other long term (current) drug therapy: Secondary | ICD-10-CM | POA: Insufficient documentation

## 2020-09-24 ENCOUNTER — Other Ambulatory Visit: Payer: Self-pay | Admitting: Family Medicine

## 2020-09-24 DIAGNOSIS — K219 Gastro-esophageal reflux disease without esophagitis: Secondary | ICD-10-CM

## 2020-09-24 MED ORDER — FAMOTIDINE 20 MG PO TABS: 20.0000 mg | ORAL_TABLET | Freq: Every day | ORAL | 3 refills | Status: DC

## 2020-09-24 NOTE — Telephone Encounter (Signed)
-----   Message from Azzie Glatter, FNP sent at 09/24/2020  1:32 PM EDT ----- Treatment was effective. H. Pylori Infection resolved.

## 2020-09-25 NOTE — Telephone Encounter (Signed)
Pt was called to discuss a medication that was sent to the pharmacy. After going over the results and pt took a look @ her medication she saw that it was as needed she stated she understood and will keep her f/u appt.

## 2021-06-22 ENCOUNTER — Ambulatory Visit
Admission: EM | Admit: 2021-06-22 | Discharge: 2021-06-22 | Disposition: A | Discharge status: Home / Self Care | Payer: Medicare PPO

## 2021-06-22 ENCOUNTER — Other Ambulatory Visit: Payer: Self-pay

## 2021-06-22 DIAGNOSIS — Z711 Person with feared health complaint in whom no diagnosis is made: Secondary | ICD-10-CM | POA: Diagnosis not present

## 2021-06-22 NOTE — Discharge Instructions (Addendum)
Make sure you keep your appointment for you mammogram that is coming up. If you develop fever, pain, drainage, redness, rash, then these are signs that we need to evaluate again in our clinic.

## 2021-06-22 NOTE — ED Triage Notes (Signed)
Pt c/o left underarm swelling states she noticed it in shower last Sunday. She is concerned due to her extensive hx of immune disease including: RA, Lupus, Sjogren's in addition to pulmonary fibrosis and osteoporosis. States this swelling has not happened before. Last mammogram was last sept and states the results were not concerning per provider. She denies feeling lumps only general swelling. Denies pain or pressure causing any discomfort.

## 2021-06-22 NOTE — ED Provider Notes (Signed)
[place]   MRN: [id number] DOB: [birth year]  Subjective:   Lorraine Blair is a 70 y.o. female presenting for concern for swelling of her right armpit area.  Patient states that she first noticed it on Sunday when she was standing upright.  Felt like she had swelling there that she had not noticed before.  Has concerns given her history of inflammatory conditions including rheumatoid arthritis, lupus.  She does get her mammograms regularly, she is due for another and has had a benign exam last year.  Denies fever, drainage of pus or bleeding, rashes, tenderness, distinct masses or nodules, breast symptoms.  No current facility-administered medications for this encounter.  Current Outpatient Medications:    Abatacept (ORENCIA IV), Inject into the vein., Disp: , Rfl:    abatacept in sodium chloride 0.9 %, Inject 750 mg into the vein. Every 4 weeks, Disp: , Rfl:    amoxicillin (AMOXIL) 500 MG tablet, Take 2 capsules (Total= 1,000 mg), by mouth, 2 times a day X 14 days., Disp: 56 tablet, Rfl: 0   cholecalciferol (VITAMIN D) 1000 units tablet, Take 1,000 Units by mouth daily., Disp: , Rfl:    Cyanocobalamin (B-12 PO), Take 1 tablet by mouth daily., Disp: , Rfl:    cycloSPORINE (RESTASIS) 0.05 % ophthalmic emulsion, 1 drop 2 (two) times daily., Disp: , Rfl:    famotidine (PEPCID) 20 MG tablet, Take 1 tablet (20 mg total) by mouth daily. As needed., Disp: 90 tablet, Rfl: 3   hydroxychloroquine (PLAQUENIL) 200 MG tablet, Take 200 mg by mouth daily., Disp: , Rfl:    omeprazole (PRILOSEC) 40 MG capsule, Take 1 capsule (40 mg total) by mouth daily. X 14 days., Disp: 14 capsule, Rfl: 0   predniSONE (DELTASONE) 10 MG tablet, Take 4 tablets by mouth daily. , Disp: , Rfl: 0   TURMERIC PO, Take 1 capsule by mouth daily., Disp: , Rfl:    No Known Allergies  Past Medical History:  Diagnosis Date   Allergic rhinitis due to pollen    Carpal tunnel syndrome    History of recent fall  03/2020   Lupus ([place]) 02/2020   Neuromuscular disorder ([place])    Pure hypercholesterolemia    Rheumatoid arthritis ([place])      Past Surgical History:  Procedure Laterality Date   TUBAL LIGATION  1989    Family History  Problem Relation Age of Onset   Cancer Father    Alzheimer's disease Father    Rheum arthritis Sister    Colon cancer Neg Hx     Social History   Tobacco Use   Smoking status: Former    Types: Cigarettes    Quit date: 06/24/1988    Years since quitting: 33.0   Smokeless tobacco: Never  Vaping Use   Vaping Use: Never used  Substance Use Topics   Alcohol use: No    Alcohol/week: 1.0 standard drink    Types: 1 Glasses of wine per week   Drug use: No    ROS   Objective:   Vitals: BP 128/81 (BP Location: Left Arm)   Pulse 76   Temp 98.1 F (36.7 C) (Oral)   Resp 18   SpO2 97%   Physical Exam Constitutional:      General: She is not in acute distress.    Appearance: Normal appearance. She is well-developed. She is not ill-appearing, toxic-appearing or diaphoretic.  HENT:     Head: Normocephalic and atraumatic.     Nose: Nose normal.  Mouth/Throat:     Mouth: Mucous membranes are moist.     Pharynx: Oropharynx is clear.  Eyes:     General: No scleral icterus.    Extraocular Movements: Extraocular movements intact.     Pupils: Pupils are equal, round, and reactive to light.  Cardiovascular:     Rate and Rhythm: Normal rate.  Pulmonary:     Effort: Pulmonary effort is normal.  Chest:  Breasts:    Right: No axillary adenopathy or supraclavicular adenopathy.     Left: No axillary adenopathy or supraclavicular adenopathy.  Lymphadenopathy:     Cervical:     Right cervical: No superficial, deep or posterior cervical adenopathy.    Left cervical: No superficial, deep or posterior cervical adenopathy.     Upper Body:     Right upper body: No supraclavicular, axillary or epitrochlear adenopathy.     Left upper body: No supraclavicular,  axillary or epitrochlear adenopathy.  Skin:    General: Skin is warm and dry.     Coloration: Skin is not jaundiced.     Findings: No bruising, erythema, lesion or rash.  Neurological:     General: No focal deficit present.     Mental Status: She is alert and oriented to person, place, and time.  Psychiatric:        Mood and Affect: Mood normal.        Behavior: Behavior normal.        Thought Content: Thought content normal.        Judgment: Judgment normal.    Assessment and Plan :   PDMP not reviewed this encounter.  1. Physically well but worried     Patient has reassuring physical exam.  Suspect that she is noticing subcutaneous tissue in the axillary region as it is very comparable to the other side.  No sign of cyst, folliculitis, hidradenitis, lymphadenopathy.  Patient had no tenderness on exam, rashes.  Recommended that she maintain her appointment for the mammogram as it is coming up soon.  Discussed warning signs, symptoms that warranted recheck.  Patient was agreeable to this and felt reassured.   Jaynee Eagles, PA-C 06/22/21 1100

## 2021-08-11 ENCOUNTER — Ambulatory Visit: Payer: Medicare PPO | Admitting: Family Medicine

## 2021-08-11 ENCOUNTER — Ambulatory Visit: Payer: Medicare PPO | Admitting: Nurse Practitioner

## 2021-09-03 ENCOUNTER — Encounter: Payer: Self-pay | Admitting: Nurse Practitioner

## 2021-09-03 ENCOUNTER — Other Ambulatory Visit: Payer: Self-pay

## 2021-09-03 ENCOUNTER — Other Ambulatory Visit: Payer: Medicare PPO

## 2021-09-03 ENCOUNTER — Ambulatory Visit: Payer: Medicare PPO | Admitting: Nurse Practitioner

## 2021-09-03 VITALS — BP 111/69 | HR 76 | Temp 97.5°F | Ht 66.0 in | Wt 157.4 lb

## 2021-09-03 DIAGNOSIS — R109 Unspecified abdominal pain: Secondary | ICD-10-CM | POA: Diagnosis not present

## 2021-09-03 DIAGNOSIS — K219 Gastro-esophageal reflux disease without esophagitis: Secondary | ICD-10-CM | POA: Diagnosis not present

## 2021-09-03 DIAGNOSIS — Z1159 Encounter for screening for other viral diseases: Secondary | ICD-10-CM

## 2021-09-03 DIAGNOSIS — R7303 Prediabetes: Secondary | ICD-10-CM

## 2021-09-03 DIAGNOSIS — M329 Systemic lupus erythematosus, unspecified: Secondary | ICD-10-CM

## 2021-09-03 DIAGNOSIS — G8929 Other chronic pain: Secondary | ICD-10-CM

## 2021-09-03 LAB — POCT GLYCOSYLATED HEMOGLOBIN (HGB A1C)
HbA1c POC (<> result, manual entry): 5.7 % (ref 4.0–5.6)
HbA1c, POC (controlled diabetic range): 5.7 % (ref 0.0–7.0)
HbA1c, POC (prediabetic range): 5.7 % (ref 5.7–6.4)
Hemoglobin A1C: 5.7 % — AB (ref 4.0–5.6)

## 2021-09-03 NOTE — Patient Instructions (Signed)
Abdominal Pain, Adult Many things can cause belly (abdominal) pain. Most times, belly pain is not dangerous. Many cases of belly pain can be watched and treated at home. Sometimes, though, belly pain is serious. Yourdoctor will try to find the cause of your belly pain. Follow these instructions at home:  Medicines Take over-the-counter and prescription medicines only as told by your doctor. Do not take medicines that help you poop (laxatives) unless told by your doctor. General instructions Watch your belly pain for any changes. Drink enough fluid to keep your pee (urine) pale yellow. Keep all follow-up visits as told by your doctor. This is important. Contact a doctor if: Your belly pain changes or gets worse. You are not hungry, or you lose weight without trying. You are having trouble pooping (constipated) or have watery poop (diarrhea) for more than 2-3 days. You have pain when you pee or poop. Your belly pain wakes you up at night. Your pain gets worse with meals, after eating, or with certain foods. You are vomiting and cannot keep anything down. You have a fever. You have blood in your pee. Get help right away if: Your pain does not go away as soon as your doctor says it should. You cannot stop vomiting. Your pain is only in areas of your belly, such as the right side or the left lower part of the belly. You have bloody or black poop, or poop that looks like tar. You have very bad pain, cramping, or bloating in your belly. You have signs of not having enough fluid or water in your body (dehydration), such as: Dark pee, very little pee, or no pee. Cracked lips. Dry mouth. Sunken eyes. Sleepiness. Weakness. You have trouble breathing or chest pain. Summary Many cases of belly pain can be watched and treated at home. Watch your belly pain for any changes. Take over-the-counter and prescription medicines only as told by your doctor. Contact a doctor if your belly pain  changes or gets worse. Get help right away if you have very bad pain, cramping, or bloating in your belly. This information is not intended to replace advice given to you by your health care provider. Make sure you discuss any questions you have with your healthcare provider. Document Revised: 03/18/2019 Document Reviewed: 03/18/2019 Elsevier Patient Education  2022 Elsevier Inc.  

## 2021-09-03 NOTE — Progress Notes (Signed)
[address], [place]  [address] Phone:  [phone]   Fax:  [phone]   Established Patient Office Visit  Subjective:  Patient ID: Lorraine Blair, female    DOB: [birth year]  Age: 70 y.o. MRN: [id number]  CC:  Chief Complaint  Patient presents with   Follow-up    Pt is here today for her follow up visit. Pt states that she is having lower abdominal pains but not consistent. Pt states it cause her to have nausea and frequent urination. Pt states that the abdominal pains worsens when she eats.        HPI Dolores Ewing Galen presents for follow up. She  has a past medical history of Allergic rhinitis due to pollen, Carpal tunnel syndrome, History of recent fall (03/2020), Lupus ([place]) (02/2020), Neuromuscular disorder ([place]), Pure hypercholesterolemia, and Rheumatoid arthritis ([place]).   Abdominal Pain Patient complains of abdominal pain. The pain is described as sharp and squeeze, and is 10/10 in intensity. Can be a mild cramping with sometimes. In the last 2 weeks she has had 2 severe attacks and one mild. he patient is experiencing periumbilical pain without radiation. Onset was several months ago. Symptoms have been gradually worsening. Aggravating factors: no particular association.  Alleviating factors: bowel movements. Associated symptoms: vomiting. The patient denies anorexia, arthralagias, belching, chills, constipation, diarrhea, fever, flatus, headache, hematochezia, hematuria, melena, myalgias, nausea, and sweats. Unable to stand o    Past Medical History:  Diagnosis Date   Allergic rhinitis due to pollen    Carpal tunnel syndrome    History of recent fall 03/2020   Lupus ([place]) 02/2020   Neuromuscular disorder ([place])    Pure hypercholesterolemia    Rheumatoid arthritis ([place])     Past Surgical History:  Procedure Laterality Date   TUBAL LIGATION  1989    Family History  Problem Relation Age of Onset   Cancer Father     Alzheimer's disease Father    Rheum arthritis Sister    Colon cancer Neg Hx     Social History   Socioeconomic History   Marital status: Single    Spouse name: Not on file   Number of children: Not on file   Years of education: Not on file   Highest education level: Not on file  Occupational History   Not on file  Tobacco Use   Smoking status: Former    Types: Cigarettes    Quit date: 06/24/1988    Years since quitting: 33.2   Smokeless tobacco: Never  Vaping Use   Vaping Use: Never used  Substance and Sexual Activity   Alcohol use: Yes    Alcohol/week: 1.0 standard drink    Types: 1 Glasses of wine per week   Drug use: No   Sexual activity: Not Currently  Other Topics Concern   Not on file  Social History Narrative   Not on file   Social Determinants of Health   Financial Resource Strain: Not on file  Food Insecurity: Not on file  Transportation Needs: Not on file  Physical Activity: Not on file  Stress: Not on file  Social Connections: Not on file  Intimate Partner Violence: Not on file    Outpatient Medications Prior to Visit  Medication Sig Dispense Refill   Abatacept (ORENCIA IV) Inject into the vein.     cholecalciferol (VITAMIN D) 1000 units tablet Take 1,000 Units by mouth daily.     Cyanocobalamin (B-12 PO)  Take 1 tablet by mouth daily.     cycloSPORINE (RESTASIS) 0.05 % ophthalmic emulsion 1 drop 2 (two) times daily.     hydroxychloroquine (PLAQUENIL) 200 MG tablet Take 200 mg by mouth daily.     Insulin Pen Needle (PEN NEEDLES) 31G X 5 MM MISC USE AS DIRECTED WITH FORTEO     loratadine (CLARITIN) 10 MG tablet TAKE 1 TABLET(10 MG) BY MOUTH DAILY     pilocarpine (SALAGEN) 5 MG tablet TAKE 1 TABLET(5 MG) BY MOUTH THREE TIMES DAILY AS NEEDED     predniSONE (DELTASONE) 1 MG tablet Take 4 mg by mouth daily.     predniSONE (DELTASONE) 10 MG tablet Take 4 tablets by mouth daily.   0   Teriparatide, Recombinant, (FORTEO) 600 MCG/2.4ML SOPN INJECT 0.08ML  (20MCG) UNDER THE SKIN ONCE DAILY     abatacept in sodium chloride 0.9 % Inject 750 mg into the vein. Every 4 weeks (Patient not taking: Reported on 09/03/2021)     TURMERIC PO Take 1 capsule by mouth daily. (Patient not taking: Reported on 09/03/2021)     amoxicillin (AMOXIL) 500 MG tablet Take 2 capsules (Total= 1,000 mg), by mouth, 2 times a day X 14 days. (Patient not taking: Reported on 09/03/2021) 56 tablet 0   famotidine (PEPCID) 20 MG tablet Take 1 tablet (20 mg total) by mouth daily. As needed. (Patient not taking: Reported on 09/03/2021) 90 tablet 3   omeprazole (PRILOSEC) 40 MG capsule Take 1 capsule (40 mg total) by mouth daily. X 14 days. (Patient not taking: Reported on 09/03/2021) 14 capsule 0   No facility-administered medications prior to visit.    No Known Allergies  ROS Review of Systems  Gastrointestinal:  Positive for abdominal pain (lower quad abdominal), nausea (occasionally associated) and vomiting. Negative for constipation.     Objective:    Physical Exam Constitutional:      Appearance: She is normal weight.  HENT:     Head: Normocephalic and atraumatic.     Nose: Nose normal.     Mouth/Throat:     Mouth: Mucous membranes are moist.  Cardiovascular:     Rate and Rhythm: Normal rate and regular rhythm.     Pulses: Normal pulses.     Heart sounds: Normal heart sounds.  Pulmonary:     Effort: Pulmonary effort is normal.     Breath sounds: Normal breath sounds.  Abdominal:     General: Bowel sounds are normal.     Palpations: Abdomen is soft.  Musculoskeletal:        General: Normal range of motion.     Cervical back: Normal range of motion.  Skin:    General: Skin is warm.     Capillary Refill: Capillary refill takes less than 2 seconds.  Neurological:     General: No focal deficit present.     Mental Status: She is alert and oriented to person, place, and time.  Psychiatric:        Mood and Affect: Mood normal.        Behavior: Behavior normal.         Thought Content: Thought content normal.        Judgment: Judgment normal.    BP 111/69   Pulse 76   Temp (!) 97.5 F (36.4 C)   Ht 5\' 6"  (1.676 m)   Wt 157 lb 6.4 oz (71.4 kg)   SpO2 99%   BMI 25.41 kg/m  Wt Readings from Last 3 Encounters:  09/03/21 157 lb 6.4 oz (71.4 kg)  08/12/20 156 lb 9.6 oz (71 kg)  04/14/20 150 lb (68 kg)     There are no preventive care reminders to display for this patient.   There are no preventive care reminders to display for this patient.  Lab Results  Component Value Date   TSH 1.650 08/12/2020   Lab Results  Component Value Date   WBC 4.2 08/12/2020   HGB 11.5 08/12/2020   HCT 34.6 08/12/2020   MCV 91 08/12/2020   PLT 215 08/12/2020   Lab Results  Component Value Date   NA 142 08/12/2020   K 4.2 08/12/2020   CO2 25 08/12/2020   GLUCOSE 82 08/12/2020   BUN 15 08/12/2020   CREATININE 0.94 08/12/2020   BILITOT 0.2 08/12/2020   ALKPHOS 81 08/12/2020   AST 22 08/12/2020   ALT 12 08/12/2020   PROT 7.3 08/12/2020   ALBUMIN 4.0 08/12/2020   CALCIUM 9.7 08/12/2020   Lab Results  Component Value Date   CHOL 184 08/12/2020   Lab Results  Component Value Date   HDL 64 08/12/2020   Lab Results  Component Value Date   LDLCALC 108 (H) 08/12/2020   Lab Results  Component Value Date   TRIG 64 08/12/2020   Lab Results  Component Value Date   CHOLHDL 2.9 08/12/2020   Lab Results  Component Value Date   HGBA1C 5.7 (A) 09/03/2021   HGBA1C 5.7 09/03/2021   HGBA1C 5.7 09/03/2021   HGBA1C 5.7 09/03/2021      Assessment & Plan:   Problem List Items Addressed This Visit       Other   Prediabetes Consider home glucose monitoring Weight loss at least 5% of current body weight is can be achieved with lifestyle modification dietary changes and regular daily exercise Encourage blood pressure control goal <120/80 and maintaining total cholesterol <200 Follow-up every 3 to 6 months for reevaluation Education  material provided    Relevant Orders   Comp. Metabolic Panel (12) (Completed)   HgB A1c (Completed)   Systemic lupus erythematosus (HCC)  Stable  Continue with current regimen. Follow up with rheumatology No changes warranted. Good patient compliance.    Other Visit Diagnoses     Gastroesophageal reflux disease without esophagitis     Worsening  Symptoms    Relevant Orders   Ambulatory referral to Gastroenterology   Chronic abdominal pain    Persistent  Labs pending    Relevant Medications   predniSONE (DELTASONE) 1 MG tablet   Other Relevant Orders   H. pylori breath test (Completed)   Amylase (Completed)   Lipase (Completed)   CBC with Differential/Platelet (Completed)   Ambulatory referral to Gastroenterology   US Abdomen Complete   Encounter for hepatitis C screening test for low risk patient       Relevant Orders   Hepatitis C antibody (Completed)       No orders of the defined types were placed in this encounter.   Follow-up: Return in about 1 year (around 09/03/2022).    Vevelyn Francois, NP

## 2021-09-04 LAB — CBC WITH DIFFERENTIAL/PLATELET
Basophils Absolute: 0 10*3/uL (ref 0.0–0.2)
Basos: 0 %
EOS (ABSOLUTE): 0.1 10*3/uL (ref 0.0–0.4)
Eos: 1 %
Hematocrit: 36.6 % (ref 34.0–46.6)
Hemoglobin: 12.7 g/dL (ref 11.1–15.9)
Immature Grans (Abs): 0 10*3/uL (ref 0.0–0.1)
Immature Granulocytes: 0 %
Lymphocytes Absolute: 1.5 10*3/uL (ref 0.7–3.1)
Lymphs: 38 %
MCH: 30.8 pg (ref 26.6–33.0)
MCHC: 34.7 g/dL (ref 31.5–35.7)
MCV: 89 fL (ref 79–97)
Monocytes Absolute: 0.3 10*3/uL (ref 0.1–0.9)
Monocytes: 7 %
Neutrophils Absolute: 2.1 10*3/uL (ref 1.4–7.0)
Neutrophils: 54 %
Platelets: 234 10*3/uL (ref 150–450)
RBC: 4.13 x10E6/uL (ref 3.77–5.28)
RDW: 13.4 % (ref 11.7–15.4)
WBC: 4 10*3/uL (ref 3.4–10.8)

## 2021-09-04 LAB — COMP. METABOLIC PANEL (12)
AST: 18 IU/L (ref 0–40)
Albumin/Globulin Ratio: 1.5 (ref 1.2–2.2)
Albumin: 4.7 g/dL (ref 3.8–4.8)
Alkaline Phosphatase: 125 IU/L — ABNORMAL HIGH (ref 44–121)
BUN/Creatinine Ratio: 19 (ref 12–28)
BUN: 19 mg/dL (ref 8–27)
Bilirubin Total: <0.2 mg/dL (ref 0.0–1.2)
Calcium: 10 mg/dL (ref 8.7–10.3)
Chloride: 100 mmol/L (ref 96–106)
Creatinine, Ser: 1.02 mg/dL — ABNORMAL HIGH (ref 0.57–1.00)
Globulin, Total: 3.2 g/dL (ref 1.5–4.5)
Glucose: 94 mg/dL (ref 70–99)
Potassium: 4.6 mmol/L (ref 3.5–5.2)
Sodium: 140 mmol/L (ref 134–144)
Total Protein: 7.9 g/dL (ref 6.0–8.5)
eGFR: 59 mL/min/{1.73_m2} — ABNORMAL LOW (ref >59)

## 2021-09-04 LAB — LIPASE: Lipase: 34 U/L (ref 14–72)

## 2021-09-04 LAB — HEPATITIS C ANTIBODY: Hep C Virus Ab: <0.1 s/co ratio (ref 0.0–0.9)

## 2021-09-04 LAB — AMYLASE: Amylase: 66 U/L (ref 31–110)

## 2021-09-06 ENCOUNTER — Encounter: Payer: Self-pay | Admitting: Nurse Practitioner

## 2021-09-06 LAB — H. PYLORI BREATH TEST: H pylori Breath Test: NEGATIVE

## 2021-09-14 ENCOUNTER — Encounter: Payer: Self-pay | Admitting: Gastroenterology

## 2021-10-04 ENCOUNTER — Ambulatory Visit: Payer: Medicare PPO | Admitting: Gastroenterology

## 2021-10-04 ENCOUNTER — Encounter: Payer: Self-pay | Admitting: Gastroenterology

## 2021-10-04 VITALS — BP 120/70 | HR 93 | Ht 66.0 in | Wt 156.0 lb

## 2021-10-04 DIAGNOSIS — R1030 Lower abdominal pain, unspecified: Secondary | ICD-10-CM

## 2021-10-04 DIAGNOSIS — R112 Nausea with vomiting, unspecified: Secondary | ICD-10-CM | POA: Diagnosis not present

## 2021-10-04 DIAGNOSIS — Z86010 Personal history of colonic polyps: Secondary | ICD-10-CM | POA: Insufficient documentation

## 2021-10-04 MED ORDER — NA SULFATE-K SULFATE-MG SULF 17.5-3.13-1.6 GM/177ML PO SOLN: 1.0000 | Freq: Once | ORAL | 0 refills | Status: AC

## 2021-10-04 NOTE — Progress Notes (Signed)
10/04/2021 [employer] [id number] 1951-04-08   HISTORY OF PRESENT ILLNESS: This is a 70 year old female who is a patient of Dr. Blanch Media.  She is only known to him for colonoscopy in November 2015.  She is here today for evaluation of abdominal pain.  She tells me taht this began over a year ago.  She said that the first time was after she had eaten some deli meat and she developed sudden onset of severe lower abdominal pain followed by nausea and vomiting.  She says that she thought that she has had food poisoning, but then a couple months later it happened again and now has continued to be a recurrent thing.  Is becoming more frequent, anywhere between 2 times a week or sometimes does not happen for 10 to 14 days.  She has not been able to identify any certain foods that seem to trigger it.  It is always sudden onset after she eats, but once the episode is finished and she is able to resume eating and feels completely fine with no residual pain, etc.  Pain is in her lower abdomen.  She reports that it only lasts approximately 10 minutes.  She always has to have a bowel movement when it occurs, but she says that the bowel movement is never diarrhea, its always abnormal formed stool.  She has had extensive lab studies have been unremarkable.  She is found to be positive for H. pylori and was treated for that.  Subsequent breath test have been negative.  She says that treating that has not made a difference, however.  She denies any heartburn or reflux type symptoms, but did try some PPI therapy for a while, but that did not make a difference.  Colonoscopy 09/2014:  1. Single polyp measuring 3 mm in size was found in the sigmoid colon; polypectomy was performed with a cold snare 2. Mild diverticulosis was noted in the sigmoid colon 3. The examination was otherwise normal.  Tubular adenoma.   Past Medical History:  Diagnosis Date   Allergic rhinitis due to pollen    Carpal tunnel  syndrome    bilateral   History of recent fall 03/2020   Neuromuscular disorder ([place])    Osteoporosis    Pulmonary fibrosis (HCC)    Pure hypercholesterolemia    Rheumatoid arthritis ([place])    Sjogren's syndrome ([place])    Past Surgical History:  Procedure Laterality Date   COLONOSCOPY     TUBAL LIGATION  11/22/1987    reports that she quit smoking about 33 years ago. Her smoking use included cigarettes. She has never used smokeless tobacco. She reports current alcohol use of about 1.0 standard drink per week. She reports that she does not use drugs. family history includes Alzheimer's disease in her father; Cancer in her father; Pancreatic cancer in her maternal grandfather and maternal uncle; Pulmonary embolism in her daughter. No Known Allergies    Outpatient Encounter Medications as of 10/04/2021  Medication Sig   Abatacept (ORENCIA IV) Inject into the vein. Every 28 days   cholecalciferol (VITAMIN D) 1000 units tablet Take 1,000 Units by mouth daily.   Cyanocobalamin (B-12 PO) Take 1 tablet by mouth daily.   cycloSPORINE (RESTASIS) 0.05 % ophthalmic emulsion 1 drop 2 (two) times daily.   Insulin Pen Needle (PEN NEEDLES) 31G X 5 MM MISC USE AS DIRECTED WITH FORTEO   loratadine (CLARITIN) 10 MG tablet TAKE 1 TABLET(10 MG) BY MOUTH DAILY   OVER THE  COUNTER MEDICATION CBD gummies- prn sleep   predniSONE (DELTASONE) 1 MG tablet Take 4 mg by mouth daily.   Teriparatide, Recombinant, (FORTEO) 600 MCG/2.4ML SOPN INJECT 0.08ML (20MCG) UNDER THE SKIN ONCE DAILY   TURMERIC PO Take 1 capsule by mouth daily as needed.   [DISCONTINUED] Abatacept (ORENCIA IV) Inject into the vein.   [DISCONTINUED] abatacept in sodium chloride 0.9 % Inject 750 mg into the vein. Every 4 weeks (Patient not taking: Reported on 09/03/2021)   [DISCONTINUED] hydroxychloroquine (PLAQUENIL) 200 MG tablet Take 200 mg by mouth daily.   [DISCONTINUED] pilocarpine (SALAGEN) 5 MG tablet TAKE 1 TABLET(5 MG) BY MOUTH THREE  TIMES DAILY AS NEEDED   [DISCONTINUED] predniSONE (DELTASONE) 10 MG tablet Take 4 tablets by mouth daily.    No facility-administered encounter medications on file as of 10/04/2021.     REVIEW OF SYSTEMS  : All other systems reviewed and negative except where noted in the History of Present Illness.   PHYSICAL EXAM: BP 120/70   Pulse 93   Ht 5\' 6"  (1.676 m)   Wt 156 lb (70.8 kg)   BMI 25.18 kg/m  General: Well developed AA female in no acute distress Head: Normocephalic and atraumatic Eyes:  Sclerae anicteric, conjunctiva pink. Ears: Normal auditory acuity Lungs: Clear throughout to auscultation; no W/R/R. Heart: Regular rate and rhythm; no M/R/G. Abdomen: Soft, non-distended.  BS present.  Non-tender. Rectal:  Will be done at the time of colonoscopy. Musculoskeletal: Symmetrical with no gross deformities  Skin: No lesions on visible extremities Extremities: No edema  Neurological: Alert oriented x 4, grossly non-focal Psychological:  Alert and cooperative. Normal mood and affect  ASSESSMENT AND PLAN: *70 year old female who describes episodes of intermittent lower abdominal pain accompanied by vomiting.  These have been occurring and increasing frequency over the past year.  They happen randomly but always after eating something, anywhere from of couple times a week to only once every 10 to 14 days or so.  They come on suddenly and only last approximately 10 minutes.  Once she is able to have a bowel movement and she vomits then she feels completely better and is even able to resume eating.  She has not been able to link it to certain foods.  Feels completely fine without any pain, etc. between episodes.  We will plan for EGD and colonoscopy with Dr. Henrene Pastor.  If this proves unremarkable then we will plan for CT scan of the abdomen and pelvis with contrast. *Personal history of colon polyps: And tubular adenoma in 2015.  We will plan for colonoscopy with Dr. Henrene Pastor.  **The risks,  benefits, and alternatives to EGD and colonoscopy were discussed with the patient and she consents to proceed.   CC:  Lorraine Francois, NP

## 2021-10-04 NOTE — Patient Instructions (Signed)
You have been scheduled for an endoscopy and colonoscopy. Please follow the written instructions given to you at your visit today. Please pick up your prep supplies at the pharmacy within the next 1-3 days. If you use inhalers (even only as needed), please bring them with you on the day of your procedure.  If you are age 70 or older, your body mass index should be between 23-30. Your Body mass index is 25.18 kg/m. If this is out of the aforementioned range listed, please consider follow up with your Primary Care Provider.  If you are age 60 or younger, your body mass index should be between 19-25. Your Body mass index is 25.18 kg/m. If this is out of the aformentioned range listed, please consider follow up with your Primary Care Provider.   ________________________________________________________  The [facility] providers would like to encourage you to use [facility] to communicate with providers for non-urgent requests or questions.  Due to long hold times on the telephone, sending your provider a message by [facility] may be a faster and more efficient way to get a response.  Please allow 48 business hours for a response.  Please remember that this is for non-urgent requests.  _______________________________________________________

## 2021-10-04 NOTE — Progress Notes (Signed)
A&P noted

## 2021-11-17 DIAGNOSIS — M0579 Rheumatoid arthritis with rheumatoid factor of multiple sites without organ or systems involvement: Secondary | ICD-10-CM | POA: Diagnosis not present

## 2021-12-02 DIAGNOSIS — J849 Interstitial pulmonary disease, unspecified: Secondary | ICD-10-CM | POA: Diagnosis not present

## 2021-12-02 DIAGNOSIS — M0579 Rheumatoid arthritis with rheumatoid factor of multiple sites without organ or systems involvement: Secondary | ICD-10-CM | POA: Diagnosis not present

## 2021-12-02 DIAGNOSIS — R053 Chronic cough: Secondary | ICD-10-CM | POA: Diagnosis not present

## 2021-12-06 ENCOUNTER — Ambulatory Visit (AMBULATORY_SURGERY_CENTER): Payer: Medicare PPO | Admitting: Internal Medicine

## 2021-12-06 ENCOUNTER — Encounter: Payer: Self-pay | Admitting: Internal Medicine

## 2021-12-06 VITALS — BP 120/70 | HR 71 | Temp 98.4°F | Resp 13 | Ht 66.0 in | Wt 156.0 lb

## 2021-12-06 DIAGNOSIS — K449 Diaphragmatic hernia without obstruction or gangrene: Secondary | ICD-10-CM

## 2021-12-06 DIAGNOSIS — R1030 Lower abdominal pain, unspecified: Secondary | ICD-10-CM | POA: Diagnosis not present

## 2021-12-06 DIAGNOSIS — Z86010 Personal history of colonic polyps: Secondary | ICD-10-CM | POA: Diagnosis not present

## 2021-12-06 DIAGNOSIS — R112 Nausea with vomiting, unspecified: Secondary | ICD-10-CM

## 2021-12-06 MED ORDER — SODIUM CHLORIDE 0.9 % IV SOLN: 500.0000 mL | Freq: Once | INTRAVENOUS | Status: DC

## 2021-12-06 NOTE — Op Note (Signed)
[facility] Patient Name: Lorraine Blair Procedure Date: 12/06/2021 10:59 AM MRN: [id number] Endoscopist: Docia Chuck. Henrene Pastor , MD Age: 71 Referring MD:  Date of Birth: 1951-04-23 Gender: Female Account #: [id number] Procedure:                Upper GI endoscopy Indications:              Abdominal pain, Nausea with vomiting Medicines:                Monitored Anesthesia Care Procedure:                Pre-Anesthesia Assessment:                           - Prior to the procedure, a History and Physical                            was performed, and patient medications and                            allergies were reviewed. The patient's tolerance of                            previous anesthesia was also reviewed. The risks                            and benefits of the procedure and the sedation                            options and risks were discussed with the patient.                            All questions were answered, and informed consent                            was obtained. Prior Anticoagulants: The patient has                            taken no previous anticoagulant or antiplatelet                            agents. ASA Grade Assessment: II - A patient with                            mild systemic disease. After reviewing the risks                            and benefits, the patient was deemed in                            satisfactory condition to undergo the procedure.                           After obtaining informed consent, the endoscope was  passed under direct vision. Throughout the                            procedure, the patient's blood pressure, pulse, and                            oxygen saturations were monitored continuously. The                            Endoscope was introduced through the mouth, and                            advanced to the second part of duodenum. The upper                            GI endoscopy was  accomplished without difficulty.                            The patient tolerated the procedure well. Scope In: Scope Out: Findings:                 The esophagus was normal.                           The stomach was normal. Small hiatal hernia.                           The examined duodenum was normal.                           The cardia and gastric fundus were normal on                            retroflexion. Complications:            No immediate complications. Estimated Blood Loss:     Estimated blood loss: none. Impression:               1. Normal EGD                           2. No cause for pain found on upper endoscopy or                            colonoscopy. Recommendation:           1. See colonoscopy report                           2. Schedule contrast-enhanced CT scan of the                            abdomen and pelvis "recurrent lower abdominal pain"                           . Docia Chuck. Henrene Pastor, MD 12/06/2021 11:55:09 AM This report has been signed electronically.

## 2021-12-06 NOTE — Progress Notes (Signed)
HISTORY OF PRESENT ILLNESS: This is a 71 year old female who is a patient of Dr. Blanch Media.  She is only known to him for colonoscopy in November 2015.  She is here today for evaluation of abdominal pain.  She tells me taht this began over a year ago.  She said that the first time was after she had eaten some deli meat and she developed sudden onset of severe lower abdominal pain followed by nausea and vomiting.  She says that she thought that she has had food poisoning, but then a couple months later it happened again and now has continued to be a recurrent thing.  Is becoming more frequent, anywhere between 2 times a week or sometimes does not happen for 10 to 14 days.  She has not been able to identify any certain foods that seem to trigger it.  It is always sudden onset after she eats, but once the episode is finished and she is able to resume eating and feels completely fine with no residual pain, etc.  Pain is in her lower abdomen.  She reports that it only lasts approximately 10 minutes.  She always has to have a bowel movement when it occurs, but she says that the bowel movement is never diarrhea, its always abnormal formed stool.  She has had extensive lab studies have been unremarkable.  She is found to be positive for H. pylori and was treated for that.  Subsequent breath test have been negative.  She says that treating that has not made a difference, however.  She denies any heartburn or reflux type symptoms, but did try some PPI therapy for a while, but that did not make a difference.   Colonoscopy 09/2014:   1. Single polyp measuring 3 mm in size was found in the sigmoid colon; polypectomy was performed with a cold snare 2. Mild diverticulosis was noted in the sigmoid colon 3. The examination was otherwise normal.   Tubular adenoma.         Past Medical History:  Diagnosis Date   Allergic rhinitis due to pollen     Carpal tunnel syndrome      bilateral   History of recent fall 03/2020    Neuromuscular disorder ([place])     Osteoporosis     Pulmonary fibrosis ([facility])     Pure hypercholesterolemia     Rheumatoid arthritis ([place])     Sjogren's syndrome ([place])           Past Surgical History:  Procedure Laterality Date   COLONOSCOPY       TUBAL LIGATION   11/22/1987     reports that she quit smoking about 33 years ago. Her smoking use included cigarettes. She has never used smokeless tobacco. She reports current alcohol use of about 1.0 standard drink per week. She reports that she does not use drugs. family history includes Alzheimer's disease in her father; Cancer in her father; Pancreatic cancer in her maternal grandfather and maternal uncle; Pulmonary embolism in her daughter. No Known Allergies         Outpatient Encounter Medications as of 10/04/2021  Medication Sig   Abatacept (ORENCIA IV) Inject into the vein. Every 28 days   cholecalciferol (VITAMIN D) 1000 units tablet Take 1,000 Units by mouth daily.   Cyanocobalamin (B-12 PO) Take 1 tablet by mouth daily.   cycloSPORINE (RESTASIS) 0.05 % ophthalmic emulsion 1 drop 2 (two) times daily.   Insulin Pen Needle (PEN NEEDLES) 31G X 5 MM MISC USE  AS DIRECTED WITH FORTEO   loratadine (CLARITIN) 10 MG tablet TAKE 1 TABLET(10 MG) BY MOUTH DAILY   OVER THE COUNTER MEDICATION CBD gummies- prn sleep   predniSONE (DELTASONE) 1 MG tablet Take 4 mg by mouth daily.   Teriparatide, Recombinant, (FORTEO) 600 MCG/2.4ML SOPN INJECT 0.08ML (20MCG) UNDER THE SKIN ONCE DAILY   TURMERIC PO Take 1 capsule by mouth daily as needed.   [DISCONTINUED] Abatacept (ORENCIA IV) Inject into the vein.   [DISCONTINUED] abatacept in sodium chloride 0.9 % Inject 750 mg into the vein. Every 4 weeks (Patient not taking: Reported on 09/03/2021)   [DISCONTINUED] hydroxychloroquine (PLAQUENIL) 200 MG tablet Take 200 mg by mouth daily.   [DISCONTINUED] pilocarpine (SALAGEN) 5 MG tablet TAKE 1 TABLET(5 MG) BY MOUTH THREE TIMES DAILY AS NEEDED   [DISCONTINUED]  predniSONE (DELTASONE) 10 MG tablet Take 4 tablets by mouth daily.     No facility-administered encounter medications on file as of 10/04/2021.        REVIEW OF SYSTEMS  : All other systems reviewed and negative except where noted in the History of Present Illness.     PHYSICAL EXAM: BP 120/70    Pulse 93    Ht 5\' 6"  (1.676 m)    Wt 156 lb (70.8 kg)    BMI 25.18 kg/m  General: Well developed AA female in no acute distress Head: Normocephalic and atraumatic Eyes:  Sclerae anicteric, conjunctiva pink. Ears: Normal auditory acuity Lungs: Clear throughout to auscultation; no W/R/R. Heart: Regular rate and rhythm; no M/R/G. Abdomen: Soft, non-distended.  BS present.  Non-tender. Rectal:  Will be done at the time of colonoscopy. Musculoskeletal: Symmetrical with no gross deformities  Skin: No lesions on visible extremities Extremities: No edema  Neurological: Alert oriented x 4, grossly non-focal Psychological:  Alert and cooperative. Normal mood and affect   ASSESSMENT AND PLAN: *71 year old female who describes episodes of intermittent lower abdominal pain accompanied by vomiting.  These have been occurring and increasing frequency over the past year.  They happen randomly but always after eating something, anywhere from of couple times a week to only once every 10 to 14 days or so.  They come on suddenly and only last approximately 10 minutes.  Once she is able to have a bowel movement and she vomits then she feels completely better and is even able to resume eating.  She has not been able to link it to certain foods.  Feels completely fine without any pain, etc. between episodes.  We will plan for EGD and colonoscopy with Dr. Henrene Pastor.  If this proves unremarkable then we will plan for CT scan of the abdomen and pelvis with contrast. *Personal history of colon polyps: And tubular adenoma in 2015.  We will plan for colonoscopy with Dr. Henrene Pastor.   **The risks, benefits, and alternatives to EGD  and colonoscopy were discussed with the patient and she consents to proceed.    CC:  Vevelyn Francois, NP

## 2021-12-06 NOTE — Op Note (Addendum)
[facility] Patient Name: Lorraine Blair Procedure Date: 12/06/2021 11:06 AM MRN: [id number] Endoscopist: Docia Chuck. Henrene Pastor , MD Age: 71 Referring MD:  Date of Birth: 1951-03-02 Gender: Female Account #: [id number] Procedure:                Colonoscopy Indications:              High risk colon cancer surveillance: Personal                            history of non-advanced adenoma (2015); negative                            index exam 2005 (elsewhere). Has been having a                            greater than 1 year history of intermittent                            postprandial lower abdominal cramping generally                            lasting less than 5 minutes and immediately and                            completely relieved with defecation. Medicines:                Monitored Anesthesia Care Procedure:                Pre-Anesthesia Assessment:                           - Prior to the procedure, a History and Physical                            was performed, and patient medications and                            allergies were reviewed. The patient's tolerance of                            previous anesthesia was also reviewed. The risks                            and benefits of the procedure and the sedation                            options and risks were discussed with the patient.                            All questions were answered, and informed consent                            was obtained. Prior Anticoagulants: The patient has  taken no previous anticoagulant or antiplatelet                            agents. ASA Grade Assessment: II - A patient with                            mild systemic disease. After reviewing the risks                            and benefits, the patient was deemed in                            satisfactory condition to undergo the procedure.                           After obtaining informed consent, the  colonoscope                            was passed under direct vision. Throughout the                            procedure, the patient's blood pressure, pulse, and                            oxygen saturations were monitored continuously. The                            Endoscope was introduced through the anus and                            advanced to the the cecum, identified by                            appendiceal orifice and ileocecal valve. The                            ileocecal valve, appendiceal orifice, and rectum                            were photographed. The quality of the bowel                            preparation was excellent. The colonoscopy was                            performed without difficulty. The patient tolerated                            the procedure well. The bowel preparation used was                            SUPREP via split dose instruction. Scope In: 11:27:16 AM Scope Out: 11:39:13 AM Scope Withdrawal Time: 0 hours 9 minutes 44 seconds  Total Procedure Duration: 0  hours 11 minutes 57 seconds  Findings:                 Multiple diverticula were found in the sigmoid                            colon.                           There was mild melanosis was found in the left                            colon.                           The exam was otherwise without abnormality on                            direct and retroflexion views. Complications:            No immediate complications. Estimated blood loss:                            None. Estimated Blood Loss:     Estimated blood loss: none. Impression:               - Diverticulosis in the sigmoid colon.                           - Melanosis in the colon.                           - The examination was otherwise normal on direct                            and retroflexion views.                           - No specimens collected. Recommendation:           - Repeat colonoscopy in 10 years for  surveillance.                           - Patient has a contact number available for                            emergencies. The signs and symptoms of potential                            delayed complications were discussed with the                            patient. Return to normal activities tomorrow.                            Written discharge instructions were provided to the  patient.                           - Resume previous diet.                           - Continue present medications.                           -EGD today. Please see report regarding findings                            and recommendations Jolyssa Oplinger N. Henrene Pastor, MD 12/06/2021 11:44:54 AM This report has been signed electronically.

## 2021-12-06 NOTE — Patient Instructions (Signed)
YOU HAD AN ENDOSCOPIC PROCEDURE TODAY AT THE [facility]:   Refer to the procedure report that was given to you for any specific questions about what was found during the examination.  If the procedure report does not answer your questions, please call your gastroenterologist to clarify.  If you requested that your care partner not be given the details of your procedure findings, then the procedure report has been included in a sealed envelope for you to review at your convenience later.  YOU SHOULD EXPECT: Some feelings of bloating in the abdomen. Passage of more gas than usual.  Walking can help get rid of the air that was put into your GI tract during the procedure and reduce the bloating. If you had a lower endoscopy (such as a colonoscopy or flexible sigmoidoscopy) you may notice spotting of blood in your stool or on the toilet paper. If you underwent a bowel prep for your procedure, you may not have a normal bowel movement for a few days.  Please Note:  You might notice some irritation and congestion in your nose or some drainage.  This is from the oxygen used during your procedure.  There is no need for concern and it should clear up in a day or so.  SYMPTOMS TO REPORT IMMEDIATELY:   Following lower endoscopy (colonoscopy or flexible sigmoidoscopy):  Excessive amounts of blood in the stool  Significant tenderness or worsening of abdominal pains  Swelling of the abdomen that is new, acute  Fever of 100F or higher   Following upper endoscopy (EGD)  Vomiting of blood or coffee ground material  New chest pain or pain under the shoulder blades  Painful or persistently difficult swallowing  New shortness of breath  Fever of 100F or higher  Black, tarry-looking stools  For urgent or emergent issues, a gastroenterologist can be reached at any hour by calling [phone]. Do not use MyChart messaging for urgent concerns.    DIET:  We do recommend a small meal at first, but  then you may proceed to your regular diet.  Drink plenty of fluids but you should avoid alcoholic beverages for 24 hours.  ACTIVITY:  You should plan to take it easy for the rest of today and you should NOT DRIVE or use heavy machinery until tomorrow (because of the sedation medicines used during the test).    FOLLOW UP: Our staff will call the number listed on your records 48-72 hours following your procedure to check on you and address any questions or concerns that you may have regarding the information given to you following your procedure. If we do not reach you, we will leave a message.  We will attempt to reach you two times.  During this call, we will ask if you have developed any symptoms of COVID 19. If you develop any symptoms (ie: fever, flu-like symptoms, shortness of breath, cough etc.) before then, please call [phone].  If you test positive for Covid 19 in the 2 weeks post procedure, please call and report this information to us.    If any biopsies were taken you will be contacted by phone or by letter within the next 1-3 weeks.  Please call us at [phone] if you have not heard about the biopsies in 3 weeks.    SIGNATURES/CONFIDENTIALITY: You and/or your care partner have signed paperwork which will be entered into your electronic medical record.  These signatures attest to the fact that that the information above on   your After Visit Summary has been reviewed and is understood.  Full responsibility of the confidentiality of this discharge information lies with you and/or your care-partner. 

## 2021-12-06 NOTE — Progress Notes (Signed)
Sedate, gd SR, tolerated procedure well, VSS, report to RN 

## 2021-12-06 NOTE — Progress Notes (Signed)
I have reviewed the patient's medical history in detail and updated the computerized patient record.  No contrast in stock on both 3rd and 4th floor. Pt instructed to come by [facility] prior to scheduled CT scan to pickup contrast.

## 2021-12-07 ENCOUNTER — Other Ambulatory Visit: Payer: Self-pay

## 2021-12-07 DIAGNOSIS — R1030 Lower abdominal pain, unspecified: Secondary | ICD-10-CM

## 2021-12-08 ENCOUNTER — Telehealth: Payer: Self-pay | Admitting: *Deleted

## 2021-12-08 NOTE — Telephone Encounter (Signed)
°  Follow up Call-  Call back number 12/06/2021  Post procedure Call Back phone  # [phone]  Permission to leave phone message Yes  Some recent data might be hidden     Patient questions:  Do you have a fever, pain , or abdominal swelling? No. Pain Score  0 *  Have you tolerated food without any problems? Yes.    Have you been able to return to your normal activities? Yes.    Do you have any questions about your discharge instructions: Diet   No. Medications  No. Follow up visit  No.  Do you have questions or concerns about your Care? No.  Actions: * If pain score is 4 or above: No action needed, pain <4.

## 2021-12-08 NOTE — Telephone Encounter (Signed)
Left message on f/u call 

## 2021-12-15 DIAGNOSIS — M0579 Rheumatoid arthritis with rheumatoid factor of multiple sites without organ or systems involvement: Secondary | ICD-10-CM | POA: Diagnosis not present

## 2021-12-16 ENCOUNTER — Other Ambulatory Visit: Payer: Self-pay

## 2021-12-16 ENCOUNTER — Encounter (HOSPITAL_COMMUNITY): Payer: Self-pay

## 2021-12-16 ENCOUNTER — Ambulatory Visit (HOSPITAL_COMMUNITY)
Admission: RE | Admit: 2021-12-16 | Discharge: 2021-12-16 | Disposition: A | Discharge status: Home / Self Care | Payer: Medicare PPO | Source: Ambulatory Visit | Attending: Internal Medicine | Admitting: Internal Medicine

## 2021-12-16 DIAGNOSIS — R1030 Lower abdominal pain, unspecified: Secondary | ICD-10-CM | POA: Diagnosis not present

## 2021-12-16 DIAGNOSIS — R109 Unspecified abdominal pain: Secondary | ICD-10-CM | POA: Diagnosis not present

## 2021-12-16 LAB — POCT I-STAT CREATININE: Creatinine, Ser: 1.1 mg/dL — ABNORMAL HIGH (ref 0.44–1.00)

## 2021-12-16 MED ORDER — SODIUM CHLORIDE (PF) 0.9 % IJ SOLN
INTRAMUSCULAR | Status: AC
  Filled 2021-12-16: qty 50

## 2021-12-16 MED ORDER — IOHEXOL 300 MG/ML  SOLN
100.0000 mL | Freq: Once | INTRAMUSCULAR | Status: AC | PRN
  Administered 2021-12-16: 100 mL via INTRAVENOUS

## 2021-12-28 DIAGNOSIS — J8410 Pulmonary fibrosis, unspecified: Secondary | ICD-10-CM | POA: Diagnosis not present

## 2021-12-28 DIAGNOSIS — M3500 Sicca syndrome, unspecified: Secondary | ICD-10-CM | POA: Diagnosis not present

## 2021-12-28 DIAGNOSIS — R0602 Shortness of breath: Secondary | ICD-10-CM | POA: Diagnosis not present

## 2021-12-28 DIAGNOSIS — M069 Rheumatoid arthritis, unspecified: Secondary | ICD-10-CM | POA: Diagnosis not present

## 2021-12-28 DIAGNOSIS — Z87891 Personal history of nicotine dependence: Secondary | ICD-10-CM | POA: Diagnosis not present

## 2022-01-06 DIAGNOSIS — M810 Age-related osteoporosis without current pathological fracture: Secondary | ICD-10-CM | POA: Diagnosis not present

## 2022-01-06 DIAGNOSIS — M3501 Sicca syndrome with keratoconjunctivitis: Secondary | ICD-10-CM | POA: Diagnosis not present

## 2022-01-06 DIAGNOSIS — Z79899 Other long term (current) drug therapy: Secondary | ICD-10-CM | POA: Diagnosis not present

## 2022-01-06 DIAGNOSIS — M0579 Rheumatoid arthritis with rheumatoid factor of multiple sites without organ or systems involvement: Secondary | ICD-10-CM | POA: Diagnosis not present

## 2022-01-06 DIAGNOSIS — J8410 Pulmonary fibrosis, unspecified: Secondary | ICD-10-CM | POA: Diagnosis not present

## 2022-01-12 DIAGNOSIS — M0579 Rheumatoid arthritis with rheumatoid factor of multiple sites without organ or systems involvement: Secondary | ICD-10-CM | POA: Diagnosis not present

## 2022-01-17 DIAGNOSIS — M810 Age-related osteoporosis without current pathological fracture: Secondary | ICD-10-CM | POA: Diagnosis not present

## 2022-02-09 DIAGNOSIS — M0579 Rheumatoid arthritis with rheumatoid factor of multiple sites without organ or systems involvement: Secondary | ICD-10-CM | POA: Diagnosis not present

## 2022-03-09 DIAGNOSIS — M0579 Rheumatoid arthritis with rheumatoid factor of multiple sites without organ or systems involvement: Secondary | ICD-10-CM | POA: Diagnosis not present

## 2022-03-30 DIAGNOSIS — Z7689 Persons encountering health services in other specified circumstances: Secondary | ICD-10-CM | POA: Diagnosis not present

## 2022-03-30 DIAGNOSIS — R7303 Prediabetes: Secondary | ICD-10-CM | POA: Diagnosis not present

## 2022-03-30 DIAGNOSIS — J8410 Pulmonary fibrosis, unspecified: Secondary | ICD-10-CM | POA: Diagnosis not present

## 2022-03-30 DIAGNOSIS — E785 Hyperlipidemia, unspecified: Secondary | ICD-10-CM | POA: Diagnosis not present

## 2022-03-30 DIAGNOSIS — Z1231 Encounter for screening mammogram for malignant neoplasm of breast: Secondary | ICD-10-CM | POA: Diagnosis not present

## 2022-03-30 DIAGNOSIS — M0579 Rheumatoid arthritis with rheumatoid factor of multiple sites without organ or systems involvement: Secondary | ICD-10-CM | POA: Diagnosis not present

## 2022-03-30 DIAGNOSIS — Z0000 Encounter for general adult medical examination without abnormal findings: Secondary | ICD-10-CM | POA: Diagnosis not present

## 2022-03-30 DIAGNOSIS — M3501 Sicca syndrome with keratoconjunctivitis: Secondary | ICD-10-CM | POA: Diagnosis not present

## 2022-04-06 DIAGNOSIS — M0579 Rheumatoid arthritis with rheumatoid factor of multiple sites without organ or systems involvement: Secondary | ICD-10-CM | POA: Diagnosis not present

## 2022-05-11 DIAGNOSIS — M0579 Rheumatoid arthritis with rheumatoid factor of multiple sites without organ or systems involvement: Secondary | ICD-10-CM | POA: Diagnosis not present

## 2022-05-11 DIAGNOSIS — Z7969 Long term (current) use of other immunomodulators and immunosuppressants: Secondary | ICD-10-CM | POA: Diagnosis not present

## 2022-05-12 DIAGNOSIS — M0579 Rheumatoid arthritis with rheumatoid factor of multiple sites without organ or systems involvement: Secondary | ICD-10-CM | POA: Diagnosis not present

## 2022-05-12 DIAGNOSIS — M3501 Sicca syndrome with keratoconjunctivitis: Secondary | ICD-10-CM | POA: Diagnosis not present

## 2022-05-12 DIAGNOSIS — J8410 Pulmonary fibrosis, unspecified: Secondary | ICD-10-CM | POA: Diagnosis not present

## 2022-05-12 DIAGNOSIS — Z79899 Other long term (current) drug therapy: Secondary | ICD-10-CM | POA: Diagnosis not present

## 2022-05-12 DIAGNOSIS — M810 Age-related osteoporosis without current pathological fracture: Secondary | ICD-10-CM | POA: Diagnosis not present

## 2022-05-12 DIAGNOSIS — E559 Vitamin D deficiency, unspecified: Secondary | ICD-10-CM | POA: Diagnosis not present

## 2022-05-25 DIAGNOSIS — H04123 Dry eye syndrome of bilateral lacrimal glands: Secondary | ICD-10-CM | POA: Diagnosis not present

## 2022-05-25 DIAGNOSIS — H40033 Anatomical narrow angle, bilateral: Secondary | ICD-10-CM | POA: Diagnosis not present

## 2022-05-30 DIAGNOSIS — M810 Age-related osteoporosis without current pathological fracture: Secondary | ICD-10-CM | POA: Diagnosis not present

## 2022-06-08 DIAGNOSIS — M25551 Pain in right hip: Secondary | ICD-10-CM | POA: Diagnosis not present

## 2022-06-08 DIAGNOSIS — M0579 Rheumatoid arthritis with rheumatoid factor of multiple sites without organ or systems involvement: Secondary | ICD-10-CM | POA: Diagnosis not present

## 2022-07-06 DIAGNOSIS — M0579 Rheumatoid arthritis with rheumatoid factor of multiple sites without organ or systems involvement: Secondary | ICD-10-CM | POA: Diagnosis not present

## 2022-07-12 DIAGNOSIS — Z7952 Long term (current) use of systemic steroids: Secondary | ICD-10-CM | POA: Diagnosis not present

## 2022-07-12 DIAGNOSIS — Z87891 Personal history of nicotine dependence: Secondary | ICD-10-CM | POA: Diagnosis not present

## 2022-07-12 DIAGNOSIS — J8410 Pulmonary fibrosis, unspecified: Secondary | ICD-10-CM | POA: Diagnosis not present

## 2022-08-01 DIAGNOSIS — Z1231 Encounter for screening mammogram for malignant neoplasm of breast: Secondary | ICD-10-CM | POA: Diagnosis not present

## 2022-08-03 DIAGNOSIS — M0579 Rheumatoid arthritis with rheumatoid factor of multiple sites without organ or systems involvement: Secondary | ICD-10-CM | POA: Diagnosis not present

## 2022-08-31 DIAGNOSIS — M0579 Rheumatoid arthritis with rheumatoid factor of multiple sites without organ or systems involvement: Secondary | ICD-10-CM | POA: Diagnosis not present

## 2022-09-02 ENCOUNTER — Ambulatory Visit: Payer: Medicare PPO | Admitting: Nurse Practitioner

## 2022-09-28 DIAGNOSIS — M0579 Rheumatoid arthritis with rheumatoid factor of multiple sites without organ or systems involvement: Secondary | ICD-10-CM | POA: Diagnosis not present

## 2022-10-04 DIAGNOSIS — E785 Hyperlipidemia, unspecified: Secondary | ICD-10-CM | POA: Diagnosis not present

## 2022-10-04 DIAGNOSIS — R7303 Prediabetes: Secondary | ICD-10-CM | POA: Diagnosis not present

## 2022-10-04 DIAGNOSIS — Z0000 Encounter for general adult medical examination without abnormal findings: Secondary | ICD-10-CM | POA: Diagnosis not present

## 2022-10-06 DIAGNOSIS — M25551 Pain in right hip: Secondary | ICD-10-CM | POA: Diagnosis not present

## 2022-10-06 DIAGNOSIS — M3501 Sicca syndrome with keratoconjunctivitis: Secondary | ICD-10-CM | POA: Diagnosis not present

## 2022-10-06 DIAGNOSIS — M810 Age-related osteoporosis without current pathological fracture: Secondary | ICD-10-CM | POA: Diagnosis not present

## 2022-10-06 DIAGNOSIS — Z79899 Other long term (current) drug therapy: Secondary | ICD-10-CM | POA: Diagnosis not present

## 2022-10-06 DIAGNOSIS — J8410 Pulmonary fibrosis, unspecified: Secondary | ICD-10-CM | POA: Diagnosis not present

## 2022-10-06 DIAGNOSIS — M0579 Rheumatoid arthritis with rheumatoid factor of multiple sites without organ or systems involvement: Secondary | ICD-10-CM | POA: Diagnosis not present

## 2022-10-26 DIAGNOSIS — M0579 Rheumatoid arthritis with rheumatoid factor of multiple sites without organ or systems involvement: Secondary | ICD-10-CM | POA: Diagnosis not present

## 2022-11-09 ENCOUNTER — Telehealth: Payer: Self-pay | Admitting: Nurse Practitioner

## 2022-11-09 NOTE — Telephone Encounter (Signed)
Left message for patient to call back and schedule Medicare Annual Wellness Visit (AWV).   Please offer to do virtually or by telephone.   Last AWV: 11/23/2016   Schedule at any time with Cole   30 minute appointment for Virtual or phone  45 minute appointment for Initial virtual/phone  Any questions, please contact me at [phone]   Thank you,   [facility]  Ambulatory Clinical Support for Care Management [place] [facility]. [facility] Are[facility] ??[phone] or ??[phone]

## 2022-11-23 DIAGNOSIS — M0579 Rheumatoid arthritis with rheumatoid factor of multiple sites without organ or systems involvement: Secondary | ICD-10-CM | POA: Diagnosis not present

## 2022-12-19 DIAGNOSIS — Z1330 Encounter for screening examination for mental health and behavioral disorders, unspecified: Secondary | ICD-10-CM | POA: Diagnosis not present

## 2022-12-19 DIAGNOSIS — M810 Age-related osteoporosis without current pathological fracture: Secondary | ICD-10-CM | POA: Diagnosis not present

## 2022-12-19 DIAGNOSIS — M0579 Rheumatoid arthritis with rheumatoid factor of multiple sites without organ or systems involvement: Secondary | ICD-10-CM | POA: Diagnosis not present

## 2022-12-19 DIAGNOSIS — Z79899 Other long term (current) drug therapy: Secondary | ICD-10-CM | POA: Diagnosis not present

## 2022-12-22 DIAGNOSIS — H40033 Anatomical narrow angle, bilateral: Secondary | ICD-10-CM | POA: Diagnosis not present

## 2022-12-22 DIAGNOSIS — H04123 Dry eye syndrome of bilateral lacrimal glands: Secondary | ICD-10-CM | POA: Diagnosis not present

## 2022-12-28 DIAGNOSIS — Z7969 Long term (current) use of other immunomodulators and immunosuppressants: Secondary | ICD-10-CM | POA: Diagnosis not present

## 2022-12-28 DIAGNOSIS — M0579 Rheumatoid arthritis with rheumatoid factor of multiple sites without organ or systems involvement: Secondary | ICD-10-CM | POA: Diagnosis not present

## 2023-01-04 DIAGNOSIS — M810 Age-related osteoporosis without current pathological fracture: Secondary | ICD-10-CM | POA: Diagnosis not present

## 2023-01-24 DIAGNOSIS — J849 Interstitial pulmonary disease, unspecified: Secondary | ICD-10-CM | POA: Diagnosis not present

## 2023-01-24 DIAGNOSIS — M069 Rheumatoid arthritis, unspecified: Secondary | ICD-10-CM | POA: Diagnosis not present

## 2023-01-24 DIAGNOSIS — Z87891 Personal history of nicotine dependence: Secondary | ICD-10-CM | POA: Diagnosis not present

## 2023-01-24 DIAGNOSIS — M3500 Sicca syndrome, unspecified: Secondary | ICD-10-CM | POA: Diagnosis not present

## 2023-01-24 DIAGNOSIS — Z7952 Long term (current) use of systemic steroids: Secondary | ICD-10-CM | POA: Diagnosis not present

## 2023-01-24 DIAGNOSIS — J8410 Pulmonary fibrosis, unspecified: Secondary | ICD-10-CM | POA: Diagnosis not present

## 2023-01-26 DIAGNOSIS — M0579 Rheumatoid arthritis with rheumatoid factor of multiple sites without organ or systems involvement: Secondary | ICD-10-CM | POA: Diagnosis not present

## 2023-02-23 DIAGNOSIS — M0579 Rheumatoid arthritis with rheumatoid factor of multiple sites without organ or systems involvement: Secondary | ICD-10-CM | POA: Diagnosis not present

## 2023-03-23 DIAGNOSIS — M0579 Rheumatoid arthritis with rheumatoid factor of multiple sites without organ or systems involvement: Secondary | ICD-10-CM | POA: Diagnosis not present

## 2023-04-04 DIAGNOSIS — G8929 Other chronic pain: Secondary | ICD-10-CM | POA: Diagnosis not present

## 2023-04-04 DIAGNOSIS — M533 Sacrococcygeal disorders, not elsewhere classified: Secondary | ICD-10-CM | POA: Diagnosis not present

## 2023-04-04 DIAGNOSIS — M25511 Pain in right shoulder: Secondary | ICD-10-CM | POA: Diagnosis not present

## 2023-04-04 DIAGNOSIS — Z0000 Encounter for general adult medical examination without abnormal findings: Secondary | ICD-10-CM | POA: Diagnosis not present

## 2023-04-04 DIAGNOSIS — R7303 Prediabetes: Secondary | ICD-10-CM | POA: Diagnosis not present

## 2023-04-04 DIAGNOSIS — E785 Hyperlipidemia, unspecified: Secondary | ICD-10-CM | POA: Diagnosis not present

## 2023-04-20 DIAGNOSIS — M0579 Rheumatoid arthritis with rheumatoid factor of multiple sites without organ or systems involvement: Secondary | ICD-10-CM | POA: Diagnosis not present

## 2023-05-03 DIAGNOSIS — M810 Age-related osteoporosis without current pathological fracture: Secondary | ICD-10-CM | POA: Diagnosis not present

## 2023-05-03 DIAGNOSIS — Z79899 Other long term (current) drug therapy: Secondary | ICD-10-CM | POA: Diagnosis not present

## 2023-05-03 DIAGNOSIS — M0579 Rheumatoid arthritis with rheumatoid factor of multiple sites without organ or systems involvement: Secondary | ICD-10-CM | POA: Diagnosis not present

## 2023-05-16 DIAGNOSIS — M25511 Pain in right shoulder: Secondary | ICD-10-CM | POA: Diagnosis not present

## 2023-05-16 DIAGNOSIS — G8929 Other chronic pain: Secondary | ICD-10-CM | POA: Diagnosis not present

## 2023-05-18 DIAGNOSIS — M0579 Rheumatoid arthritis with rheumatoid factor of multiple sites without organ or systems involvement: Secondary | ICD-10-CM | POA: Diagnosis not present

## 2023-06-07 DIAGNOSIS — Z7409 Other reduced mobility: Secondary | ICD-10-CM | POA: Diagnosis not present

## 2023-06-07 DIAGNOSIS — G8929 Other chronic pain: Secondary | ICD-10-CM | POA: Diagnosis not present

## 2023-06-07 DIAGNOSIS — M25511 Pain in right shoulder: Secondary | ICD-10-CM | POA: Diagnosis not present

## 2023-06-07 DIAGNOSIS — R6889 Other general symptoms and signs: Secondary | ICD-10-CM | POA: Diagnosis not present

## 2023-06-07 DIAGNOSIS — R293 Abnormal posture: Secondary | ICD-10-CM | POA: Diagnosis not present

## 2023-06-13 DIAGNOSIS — Z7409 Other reduced mobility: Secondary | ICD-10-CM | POA: Diagnosis not present

## 2023-06-13 DIAGNOSIS — G8929 Other chronic pain: Secondary | ICD-10-CM | POA: Diagnosis not present

## 2023-06-13 DIAGNOSIS — R293 Abnormal posture: Secondary | ICD-10-CM | POA: Diagnosis not present

## 2023-06-13 DIAGNOSIS — R6889 Other general symptoms and signs: Secondary | ICD-10-CM | POA: Diagnosis not present

## 2023-06-13 DIAGNOSIS — M25511 Pain in right shoulder: Secondary | ICD-10-CM | POA: Diagnosis not present

## 2023-06-16 DIAGNOSIS — N390 Urinary tract infection, site not specified: Secondary | ICD-10-CM | POA: Diagnosis not present

## 2023-06-16 DIAGNOSIS — M0579 Rheumatoid arthritis with rheumatoid factor of multiple sites without organ or systems involvement: Secondary | ICD-10-CM | POA: Diagnosis not present

## 2023-06-16 DIAGNOSIS — K529 Noninfective gastroenteritis and colitis, unspecified: Secondary | ICD-10-CM | POA: Diagnosis not present

## 2023-06-19 DIAGNOSIS — R6889 Other general symptoms and signs: Secondary | ICD-10-CM | POA: Diagnosis not present

## 2023-06-19 DIAGNOSIS — R293 Abnormal posture: Secondary | ICD-10-CM | POA: Diagnosis not present

## 2023-06-19 DIAGNOSIS — M25511 Pain in right shoulder: Secondary | ICD-10-CM | POA: Diagnosis not present

## 2023-06-19 DIAGNOSIS — Z7409 Other reduced mobility: Secondary | ICD-10-CM | POA: Diagnosis not present

## 2023-06-19 DIAGNOSIS — G8929 Other chronic pain: Secondary | ICD-10-CM | POA: Diagnosis not present

## 2023-06-21 DIAGNOSIS — M25511 Pain in right shoulder: Secondary | ICD-10-CM | POA: Diagnosis not present

## 2023-06-21 DIAGNOSIS — R6889 Other general symptoms and signs: Secondary | ICD-10-CM | POA: Diagnosis not present

## 2023-06-21 DIAGNOSIS — Z7409 Other reduced mobility: Secondary | ICD-10-CM | POA: Diagnosis not present

## 2023-06-21 DIAGNOSIS — R293 Abnormal posture: Secondary | ICD-10-CM | POA: Diagnosis not present

## 2023-06-21 DIAGNOSIS — G8929 Other chronic pain: Secondary | ICD-10-CM | POA: Diagnosis not present

## 2023-06-26 DIAGNOSIS — G8929 Other chronic pain: Secondary | ICD-10-CM | POA: Diagnosis not present

## 2023-06-26 DIAGNOSIS — M25511 Pain in right shoulder: Secondary | ICD-10-CM | POA: Diagnosis not present

## 2023-06-26 DIAGNOSIS — R6889 Other general symptoms and signs: Secondary | ICD-10-CM | POA: Diagnosis not present

## 2023-06-26 DIAGNOSIS — R293 Abnormal posture: Secondary | ICD-10-CM | POA: Diagnosis not present

## 2023-06-26 DIAGNOSIS — Z7409 Other reduced mobility: Secondary | ICD-10-CM | POA: Diagnosis not present

## 2023-06-27 DIAGNOSIS — M0579 Rheumatoid arthritis with rheumatoid factor of multiple sites without organ or systems involvement: Secondary | ICD-10-CM | POA: Diagnosis not present

## 2023-06-28 DIAGNOSIS — R293 Abnormal posture: Secondary | ICD-10-CM | POA: Diagnosis not present

## 2023-06-28 DIAGNOSIS — Z7409 Other reduced mobility: Secondary | ICD-10-CM | POA: Diagnosis not present

## 2023-06-28 DIAGNOSIS — R6889 Other general symptoms and signs: Secondary | ICD-10-CM | POA: Diagnosis not present

## 2023-06-28 DIAGNOSIS — M25511 Pain in right shoulder: Secondary | ICD-10-CM | POA: Diagnosis not present

## 2023-06-28 DIAGNOSIS — G8929 Other chronic pain: Secondary | ICD-10-CM | POA: Diagnosis not present

## 2023-07-11 DIAGNOSIS — M810 Age-related osteoporosis without current pathological fracture: Secondary | ICD-10-CM | POA: Diagnosis not present

## 2023-07-18 DIAGNOSIS — R21 Rash and other nonspecific skin eruption: Secondary | ICD-10-CM | POA: Diagnosis not present

## 2023-07-19 DIAGNOSIS — Z79899 Other long term (current) drug therapy: Secondary | ICD-10-CM | POA: Diagnosis not present

## 2023-07-19 DIAGNOSIS — M0579 Rheumatoid arthritis with rheumatoid factor of multiple sites without organ or systems involvement: Secondary | ICD-10-CM | POA: Diagnosis not present

## 2023-07-19 DIAGNOSIS — L931 Subacute cutaneous lupus erythematosus: Secondary | ICD-10-CM | POA: Diagnosis not present

## 2023-07-19 DIAGNOSIS — M810 Age-related osteoporosis without current pathological fracture: Secondary | ICD-10-CM | POA: Diagnosis not present
